# Patient Record
Sex: Female | Born: 1955 | Race: White | Hispanic: No | Marital: Married | Smoking: Former smoker
Health system: Southern US, Community
[De-identification: ages and names within clinical notes are randomized; demographics above are authoritative.]

## PROBLEM LIST (undated history)

## (undated) DIAGNOSIS — F419 Anxiety disorder, unspecified: Secondary | ICD-10-CM

## (undated) DIAGNOSIS — K635 Polyp of colon: Secondary | ICD-10-CM

## (undated) DIAGNOSIS — R112 Nausea with vomiting, unspecified: Secondary | ICD-10-CM

## (undated) DIAGNOSIS — G4700 Insomnia, unspecified: Secondary | ICD-10-CM

## (undated) DIAGNOSIS — K602 Anal fissure, unspecified: Secondary | ICD-10-CM

## (undated) DIAGNOSIS — K219 Gastro-esophageal reflux disease without esophagitis: Secondary | ICD-10-CM

## (undated) DIAGNOSIS — H8109 Meniere's disease, unspecified ear: Secondary | ICD-10-CM

## (undated) DIAGNOSIS — F329 Major depressive disorder, single episode, unspecified: Secondary | ICD-10-CM

## (undated) DIAGNOSIS — K589 Irritable bowel syndrome without diarrhea: Secondary | ICD-10-CM

## (undated) DIAGNOSIS — J309 Allergic rhinitis, unspecified: Secondary | ICD-10-CM

## (undated) DIAGNOSIS — E039 Hypothyroidism, unspecified: Secondary | ICD-10-CM

## (undated) DIAGNOSIS — H9190 Unspecified hearing loss, unspecified ear: Secondary | ICD-10-CM

## (undated) DIAGNOSIS — F4310 Post-traumatic stress disorder, unspecified: Secondary | ICD-10-CM

## (undated) DIAGNOSIS — E785 Hyperlipidemia, unspecified: Secondary | ICD-10-CM

## (undated) DIAGNOSIS — K5790 Diverticulosis of intestine, part unspecified, without perforation or abscess without bleeding: Secondary | ICD-10-CM

## (undated) DIAGNOSIS — M1990 Unspecified osteoarthritis, unspecified site: Secondary | ICD-10-CM

## (undated) DIAGNOSIS — G43909 Migraine, unspecified, not intractable, without status migrainosus: Secondary | ICD-10-CM

## (undated) DIAGNOSIS — G4733 Obstructive sleep apnea (adult) (pediatric): Secondary | ICD-10-CM

## (undated) DIAGNOSIS — F32A Depression, unspecified: Secondary | ICD-10-CM

## (undated) DIAGNOSIS — Z9889 Other specified postprocedural states: Secondary | ICD-10-CM

## (undated) DIAGNOSIS — J45909 Unspecified asthma, uncomplicated: Secondary | ICD-10-CM

## (undated) DIAGNOSIS — A630 Anogenital (venereal) warts: Secondary | ICD-10-CM

## (undated) HISTORY — DX: Anogenital (venereal) warts: A63.0

## (undated) HISTORY — PX: ABDOMINAL HYSTERECTOMY: SHX81

## (undated) HISTORY — PX: BUNIONECTOMY: SHX129

## (undated) HISTORY — DX: Insomnia, unspecified: G47.00

## (undated) HISTORY — DX: Anal fissure, unspecified: K60.2

## (undated) HISTORY — DX: Obstructive sleep apnea (adult) (pediatric): G47.33

## (undated) HISTORY — DX: Meniere's disease, unspecified ear: H81.09

## (undated) HISTORY — DX: Post-traumatic stress disorder, unspecified: F43.10

## (undated) HISTORY — DX: Depression, unspecified: F32.A

## (undated) HISTORY — DX: Hyperlipidemia, unspecified: E78.5

## (undated) HISTORY — DX: Gastro-esophageal reflux disease without esophagitis: K21.9

## (undated) HISTORY — PX: OTHER SURGICAL HISTORY: SHX169

## (undated) HISTORY — DX: Unspecified asthma, uncomplicated: J45.909

## (undated) HISTORY — DX: Irritable bowel syndrome, unspecified: K58.9

## (undated) HISTORY — DX: Major depressive disorder, single episode, unspecified: F32.9

## (undated) HISTORY — DX: Unspecified osteoarthritis, unspecified site: M19.90

## (undated) HISTORY — DX: Hypothyroidism, unspecified: E03.9

## (undated) HISTORY — DX: Allergic rhinitis, unspecified: J30.9

## (undated) HISTORY — DX: Migraine, unspecified, not intractable, without status migrainosus: G43.909

## (undated) HISTORY — DX: Polyp of colon: K63.5

## (undated) HISTORY — PX: TUBAL LIGATION: SHX77

---
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
## 1979-02-08 HISTORY — PX: CHOLECYSTECTOMY: SHX55

## 1979-02-08 HISTORY — PX: APPENDECTOMY: SHX54

## 1991-02-08 HISTORY — PX: INNER EAR SURGERY: SHX679

## 1997-08-25 ENCOUNTER — Emergency Department (HOSPITAL_COMMUNITY): Admission: EM | Admit: 1997-08-25 | Discharge: 1997-08-25 | Payer: Self-pay | Admitting: Emergency Medicine

## 1997-11-17 ENCOUNTER — Encounter: Payer: Self-pay | Admitting: Neurology

## 1997-11-17 ENCOUNTER — Ambulatory Visit (HOSPITAL_COMMUNITY): Admission: RE | Admit: 1997-11-17 | Discharge: 1997-11-17 | Payer: Self-pay | Admitting: Neurology

## 1998-01-29 ENCOUNTER — Inpatient Hospital Stay (HOSPITAL_COMMUNITY): Admission: AD | Admit: 1998-01-29 | Discharge: 1998-01-29 | Payer: Self-pay | Admitting: *Deleted

## 2000-09-11 ENCOUNTER — Encounter: Payer: Self-pay | Admitting: Otolaryngology

## 2000-09-11 ENCOUNTER — Encounter: Admission: RE | Admit: 2000-09-11 | Discharge: 2000-09-11 | Payer: Self-pay | Admitting: Otolaryngology

## 2002-02-11 ENCOUNTER — Emergency Department (HOSPITAL_COMMUNITY): Admission: EM | Admit: 2002-02-11 | Discharge: 2002-02-11 | Payer: Self-pay | Admitting: Emergency Medicine

## 2004-11-04 ENCOUNTER — Ambulatory Visit: Payer: Self-pay | Admitting: Family Medicine

## 2005-05-19 ENCOUNTER — Ambulatory Visit: Payer: Self-pay | Admitting: *Deleted

## 2005-05-20 ENCOUNTER — Observation Stay (HOSPITAL_COMMUNITY): Admission: EM | Admit: 2005-05-20 | Discharge: 2005-05-20 | Payer: Self-pay | Admitting: Emergency Medicine

## 2005-05-23 ENCOUNTER — Ambulatory Visit: Payer: Self-pay

## 2005-05-23 ENCOUNTER — Ambulatory Visit: Payer: Self-pay | Admitting: Internal Medicine

## 2006-06-10 ENCOUNTER — Emergency Department: Payer: Self-pay | Admitting: Emergency Medicine

## 2006-06-10 ENCOUNTER — Other Ambulatory Visit: Payer: Self-pay

## 2006-07-19 ENCOUNTER — Ambulatory Visit: Payer: Self-pay | Admitting: Unknown Physician Specialty

## 2006-08-03 ENCOUNTER — Ambulatory Visit: Payer: Self-pay | Admitting: Unknown Physician Specialty

## 2007-08-15 ENCOUNTER — Ambulatory Visit: Payer: Self-pay | Admitting: Unknown Physician Specialty

## 2007-12-14 ENCOUNTER — Encounter: Payer: Self-pay | Admitting: Gastroenterology

## 2008-01-14 ENCOUNTER — Ambulatory Visit: Payer: Self-pay | Admitting: Gastroenterology

## 2008-01-14 DIAGNOSIS — K219 Gastro-esophageal reflux disease without esophagitis: Secondary | ICD-10-CM | POA: Insufficient documentation

## 2008-01-14 DIAGNOSIS — K589 Irritable bowel syndrome without diarrhea: Secondary | ICD-10-CM | POA: Insufficient documentation

## 2008-01-15 ENCOUNTER — Telehealth: Payer: Self-pay | Admitting: Gastroenterology

## 2008-02-12 ENCOUNTER — Ambulatory Visit: Payer: Self-pay | Admitting: Gastroenterology

## 2008-02-14 ENCOUNTER — Telehealth: Payer: Self-pay | Admitting: Gastroenterology

## 2008-02-15 ENCOUNTER — Telehealth: Payer: Self-pay | Admitting: Gastroenterology

## 2008-02-18 ENCOUNTER — Telehealth: Payer: Self-pay | Admitting: Gastroenterology

## 2008-02-18 ENCOUNTER — Telehealth (INDEPENDENT_AMBULATORY_CARE_PROVIDER_SITE_OTHER): Payer: Self-pay | Admitting: *Deleted

## 2008-02-26 ENCOUNTER — Telehealth: Payer: Self-pay | Admitting: Gastroenterology

## 2008-02-27 ENCOUNTER — Telehealth: Payer: Self-pay | Admitting: Gastroenterology

## 2008-03-03 ENCOUNTER — Encounter: Payer: Self-pay | Admitting: Gastroenterology

## 2008-03-03 ENCOUNTER — Ambulatory Visit: Payer: Self-pay | Admitting: Gastroenterology

## 2008-03-03 LAB — HM COLONOSCOPY

## 2008-03-05 ENCOUNTER — Encounter: Payer: Self-pay | Admitting: Gastroenterology

## 2008-05-01 ENCOUNTER — Telehealth: Payer: Self-pay | Admitting: Gastroenterology

## 2008-05-15 ENCOUNTER — Emergency Department: Payer: Self-pay | Admitting: Emergency Medicine

## 2008-06-04 ENCOUNTER — Telehealth: Payer: Self-pay | Admitting: Gastroenterology

## 2008-08-12 ENCOUNTER — Emergency Department: Payer: Self-pay | Admitting: Emergency Medicine

## 2008-08-14 ENCOUNTER — Emergency Department: Payer: Self-pay | Admitting: Unknown Physician Specialty

## 2008-10-02 ENCOUNTER — Ambulatory Visit: Payer: Self-pay | Admitting: Unknown Physician Specialty

## 2009-04-09 ENCOUNTER — Encounter: Payer: Self-pay | Admitting: Gastroenterology

## 2009-11-25 ENCOUNTER — Ambulatory Visit: Payer: Self-pay | Admitting: Unknown Physician Specialty

## 2010-03-09 NOTE — Letter (Signed)
Summary: ENT/[facility]  ENT/[facility]   Imported By: Sherian Rein 04/29/2009 15:12:50  _____________________________________________________________________  External Attachment:    Type:   Image     Comment:   External Document

## 2010-06-25 NOTE — Discharge Summary (Signed)
NAMEPAMILA, Jenna Garner                  ACCOUNT NO.:  [id number]   MEDICAL RECORD NO.:  [id number]          PATIENT TYPE:  OBV   LOCATION:  2005                         FACILITY:  [facility]   PHYSICIAN:  Jesse Sans. Wall, M.D.   DATE OF BIRTH:  06/12/1955   DATE OF ADMISSION:  05/19/2005  DATE OF DISCHARGE:  05/20/2005                                 DISCHARGE SUMMARY   PRIMARY CARDIOLOGIST:  Maisie Fus C. Wall, M.D.   PRIMARY CARE PHYSICIAN:  No known primary care physician.   DISCHARGE DIAGNOSIS:  Chest pain with negative cardiac work-up.   PAST MEDICAL HISTORY:  1.  Meniere's' disease.  2.  Irritable bowel syndrome.  3.  Post traumatic stress disorder.  4.  Hypothyroidism.  5.  Obstructive sleep apnea.  6.  Status post TAH/BSO.  7.  Status post cholecystectomy.   ALLERGIES:  EGGS, PENICILLIN, SULFA and CODEINE.   HOSPITAL COURSE:  [name] is a 55 year old African American female with no  known cardiac history who presented to [facility]  Emergency Room for evaluation of chest pain and pressure.  Apparently  patient borrowed three of her husband's nitroglycerin which did alleviate  her pain.  She presented to [facility] [employer] [facility] for further  evaluation.  Initial EKG in the emergency room showed a normal sinus rhythm  with no evidence of ST or T-wave changes.  Initial cardiac markers were  negative.  Chest x-ray revealed no acute cardiopulmonary processes.  Lab  work stable with hematocrit of 42, BUN 13, creatinine 1.  Patient was  admitted for 23-hour observation to rule out myocardial infarction.  Patient  was not given aspirin secondary to her Meniere's' disease/aspirin  intolerance.  Patient without any further episodes of chest discomfort.  Blood pressure stable at 103/65, afebrile, sat 95% on room air.  Cardiac  enzymes negative x2.  Patient discharged home after seen by Dr. Juanito Doom on  the day of discharge with arrangements for an outpatient  Myoview.  Stress  test was arranged for May 23, 2005, at 9 a.m.  Follow-up with Dr. Vern Claude  extender on May 25, 2005, at 1:45.  Patient instructed in regard to  prestress test instructions and verbalizes understanding.   DISCHARGE MEDICATIONS:  1.  Triamterene/hydrochlorothiazide 37.5/25 mg daily.  2.  Premarin 0.3 mg daily.  3.  Prozac 50 mg daily.  4.  Allegra 180 mg daily.  5.  Cytomel 0.025 mg daily.  6.  Wellbutrin 150 mg p.o. b.i.d.  7.  Valtrex 500 mg daily.  8.  Singulair 10 mg daily.  9.  Aspirin 81 mg daily as tolerated.  10. Lodine 500 mg p.o. b.i.d.  11. Meclizine p.r.n.  12. __________ as previously prescribed.   DURATION OF DISCHARGE ENCOUNTER:  25 minutes.      Dorian Pod, NP      Jesse Sans. Wall, M.D.  Electronically Signed    MB/MEDQ  D:  06/27/2005  T:  06/28/2005  Job:  [id number]

## 2010-06-25 NOTE — H&P (Signed)
NAMELAVONN, Jenna Garner NO.:  [id number]   MEDICAL RECORD NO.:  [id number]          PATIENT TYPE:  EMS   LOCATION:  MAJO                         FACILITY:  [facility]   PHYSICIAN:  Audery Amel, MD     DATE OF BIRTH:  12/14/1955   DATE OF ADMISSION:  05/19/2005  DATE OF DISCHARGE:                                HISTORY & PHYSICAL   CARDIOLOGIST:  Unattached.   CHIEF COMPLAINT:  Right chest pain and pressure.   HISTORY OF PRESENT ILLNESS:  The patient is a 55 year old black female with  no prior cardiac history who presents to the emergency department  for  evaluation of chest pain and pressure. The patient characterizes this pain  as left-sided and radiating to her left neck. She states that the pain was  while at rest earlier this evening. She notes associated with no vomiting  and mild shortness of breath at the time of onset. She denies any  diaphoresis or palpitations. The patient borrowed three of her husband's  nitroglycerin which did alleviate her pain. There were no precipitating  factors and she denies any palpable tinnitus or pain with deep inspiration.  Currently she is chest pain free. Her EKG in the emergency department  showed a normal sinus rhythm with no evidence of anterior ischemia. Her  initial cardiac markers were negative; otherwise, she had no complaints.   PAST MEDICAL HISTORY:  1.  Meniere's disease.  2.  IBS.  3.  PTSD.  4.  Hypothyroidism.  5.  Obstructive sleep apnea.  6.  Status post cholecystectomy.  7.  Status post TAH/BSO.   ALLERGIES:  EGGS, PENICILLIN, SULFA, CODEINE.   MEDICATIONS:  1.  Allegra 180 mg daily.  2.  Astelin nasal spray, one spray each nostril daily.  3.  Amour thyroid, dose unknown.  4.  Dyazide 37/25 mg daily.  5.  Premarin 0.3 mg daily.  6.  Prozac 20 mg daily.  7.  Xanax 0.5 mg daily.  8.  Wellbutrin XR 150 mg b.i.d.  9.  Valtrex 500 mg daily.  10. Singulair 10 mg daily.  11. Lodine 500 mg daily.   SOCIAL HISTORY:  The patient lives in [place] with her husband and is on  disability  from her Meniere's disease. She denies any history of tobacco,  alcohol, or illicit substance use. She does lead a relatively sedentary  lifestyle.   FAMILY HISTORY:  There is no history of coronary artery disease in her  mother, father, or siblings. However, on her mother's side of the family she  did have an uncle who died  of a myocardial infarction at age 71.   REVIEW OF SYSTEMS:  CONSTITUTIONAL: No fever, chills, sweats, or adenopathy.  HEENT: No headache, sore throat, photophobia, or change in vision. SKIN: No  rash or lesions. CARDIOPULMONARY: Per HPI. GU: No frequency, urgency, or  dysuria. NEUROPSYCHIATRIC:  No weakness, numbness, or mood disturbance.  MUSCULOSKELETAL: No myalgias, arthralgias, or joint swellings. GI: No  vomiting, diarrhea, bright red blood per rectum, melena, hematemesis, or  dysphagia. ENDOCRINE: No polyuria  or polydipsia. No heat or cold  intolerance. All other review of systems are negative.   PHYSICAL EXAMINATION:  VITAL SIGNS: Blood pressure 128/100, pulse 77, O2  saturation is 96% on room air.  GENERAL: The patient is alert and oriented times three, in no acute  distress, pleasantly conversant.  HEENT: Normocephalic and atraumatic. EOMI. PERRL. Nose patent. Oropharynx  clear.  NECK: Supple. Full range of motion. No JVD.  LYMPHADENOPATHY: None.  CARDIOVASCULAR: Normal S1 and S2 without murmurs, rubs, or gallops, with 2+  symmetric pulses bilaterally. No bruits.  LUNGS: Clear to auscultation bilaterally.  SKIN: No rashes or lesion.  ABDOMEN: Soft, nontender, nondistended. Positive bowel sounds. No  hepatosplenomegaly.  EXTREMITIES: No clubbing, cyanosis, or edema. There is no rash or lesion.  MUSCULOSKELETAL: No joint deformities or effusion.  NEUROLOGIC: Cranial nerves II-XII are grossly intact; 5/5 strength in  bilateral extremities. Sensation is grossly intact.    Chest x-ray reveals no acute cardiopulmonary process. EKG reveals a normal  sinus rhythm with no evidence of injury or ischemia.   LABORATORY DATA:  White blood cell count 7.3, hematocrit 42, platelet count  277,000. Sodium 136, potassium 3.9, chloride 99, CO2 28, BUN 13, creatinine  1.0, glucose 89. CK-MB less than 1, troponin less than 0.05.   IMPRESSION:  A 55 year old white female with left-sided chest pain, atypical  for cardiac origin.   Admit for 23 hours observation to rule out myocardial infarction. Cycle  cardiac biomarkers and serial EKGs. A 24-hour telemetry. The patient has  aspirin intolerance due to her Meniere's disease and therefore cannot  tolerate. Will continue with her home medication regimen. The patient is  unclear of her Armour thyroid dose for her hyperthyroidism. This will have  to be clarified in the morning. The patient rules out for myocardial  infarction, given the atypical nature of her symptoms and lack of risk  factors would favor noninvasive evaluation as an outpatient.           ______________________________  Audery Amel, MD     SHG/MEDQ  D:  05/20/2005  T:  05/20/2005  Job:  [id number]

## 2010-08-16 LAB — TSH: TSH: 5.23 u[IU]/mL (ref 0.41–5.90)

## 2010-08-16 LAB — BASIC METABOLIC PANEL
BUN: 11 mg/dL (ref 4–21)
Creatinine: 1 mg/dL (ref 0.5–1.1)
Glucose: 95 mg/dL

## 2010-08-16 LAB — HEPATIC FUNCTION PANEL
ALT: 27 U/L (ref 7–35)
Alkaline Phosphatase: 72 U/L (ref 25–125)
Bilirubin, Total: 0.8 mg/dL

## 2010-08-16 LAB — LIPID PANEL: LDL Cholesterol: 131 mg/dL

## 2010-08-30 DIAGNOSIS — M545 Low back pain, unspecified: Secondary | ICD-10-CM | POA: Insufficient documentation

## 2010-08-30 DIAGNOSIS — J301 Allergic rhinitis due to pollen: Secondary | ICD-10-CM | POA: Insufficient documentation

## 2010-08-30 DIAGNOSIS — K5730 Diverticulosis of large intestine without perforation or abscess without bleeding: Secondary | ICD-10-CM | POA: Insufficient documentation

## 2011-01-13 ENCOUNTER — Ambulatory Visit: Payer: Self-pay | Admitting: Unknown Physician Specialty

## 2011-09-07 LAB — LIPID PANEL: Triglycerides: 203 mg/dL — AB (ref 40–160)

## 2011-09-07 LAB — HEPATIC FUNCTION PANEL: ALT: 28 U/L (ref 7–35)

## 2011-09-07 LAB — BASIC METABOLIC PANEL
BUN: 11 mg/dL (ref 4–21)
Creatinine: 0.9 mg/dL (ref 0.5–1.1)
Glucose: 93 mg/dL
Sodium: 130 mmol/L — AB (ref 137–147)

## 2011-09-07 LAB — CBC AND DIFFERENTIAL: Platelets: 283 10*3/uL (ref 150–399)

## 2011-12-12 LAB — HM MAMMOGRAPHY

## 2012-05-29 ENCOUNTER — Ambulatory Visit: Payer: Self-pay | Admitting: Unknown Physician Specialty

## 2012-08-23 ENCOUNTER — Telehealth: Payer: Self-pay | Admitting: Internal Medicine

## 2012-08-23 ENCOUNTER — Ambulatory Visit (INDEPENDENT_AMBULATORY_CARE_PROVIDER_SITE_OTHER): Payer: Medicare Other | Admitting: Internal Medicine

## 2012-08-23 ENCOUNTER — Encounter: Payer: Self-pay | Admitting: Internal Medicine

## 2012-08-23 ENCOUNTER — Other Ambulatory Visit (INDEPENDENT_AMBULATORY_CARE_PROVIDER_SITE_OTHER): Payer: Medicare Other

## 2012-08-23 VITALS — BP 140/98 | HR 65 | Temp 97.0°F | Ht 61.75 in | Wt 188.4 lb

## 2012-08-23 DIAGNOSIS — M1710 Unilateral primary osteoarthritis, unspecified knee: Secondary | ICD-10-CM

## 2012-08-23 DIAGNOSIS — F32A Depression, unspecified: Secondary | ICD-10-CM

## 2012-08-23 DIAGNOSIS — G43909 Migraine, unspecified, not intractable, without status migrainosus: Secondary | ICD-10-CM | POA: Insufficient documentation

## 2012-08-23 DIAGNOSIS — J45909 Unspecified asthma, uncomplicated: Secondary | ICD-10-CM | POA: Insufficient documentation

## 2012-08-23 DIAGNOSIS — N951 Menopausal and female climacteric states: Secondary | ICD-10-CM

## 2012-08-23 DIAGNOSIS — IMO0002 Reserved for concepts with insufficient information to code with codable children: Secondary | ICD-10-CM

## 2012-08-23 DIAGNOSIS — E785 Hyperlipidemia, unspecified: Secondary | ICD-10-CM

## 2012-08-23 DIAGNOSIS — H8109 Meniere's disease, unspecified ear: Secondary | ICD-10-CM | POA: Insufficient documentation

## 2012-08-23 DIAGNOSIS — E039 Hypothyroidism, unspecified: Secondary | ICD-10-CM

## 2012-08-23 DIAGNOSIS — G4733 Obstructive sleep apnea (adult) (pediatric): Secondary | ICD-10-CM | POA: Insufficient documentation

## 2012-08-23 DIAGNOSIS — Z0000 Encounter for general adult medical examination without abnormal findings: Secondary | ICD-10-CM

## 2012-08-23 DIAGNOSIS — F329 Major depressive disorder, single episode, unspecified: Secondary | ICD-10-CM

## 2012-08-23 DIAGNOSIS — M79604 Pain in right leg: Secondary | ICD-10-CM

## 2012-08-23 DIAGNOSIS — E871 Hypo-osmolality and hyponatremia: Secondary | ICD-10-CM

## 2012-08-23 DIAGNOSIS — F4310 Post-traumatic stress disorder, unspecified: Secondary | ICD-10-CM | POA: Insufficient documentation

## 2012-08-23 DIAGNOSIS — J309 Allergic rhinitis, unspecified: Secondary | ICD-10-CM | POA: Insufficient documentation

## 2012-08-23 DIAGNOSIS — N959 Unspecified menopausal and perimenopausal disorder: Secondary | ICD-10-CM

## 2012-08-23 DIAGNOSIS — A630 Anogenital (venereal) warts: Secondary | ICD-10-CM | POA: Insufficient documentation

## 2012-08-23 LAB — HEPATIC FUNCTION PANEL
Alkaline Phosphatase: 66 U/L (ref 39–117)
Bilirubin, Direct: 0.1 mg/dL (ref 0.0–0.3)
Total Bilirubin: 0.6 mg/dL (ref 0.3–1.2)

## 2012-08-23 LAB — CBC WITH DIFFERENTIAL/PLATELET
Basophils Absolute: 0 10*3/uL (ref 0.0–0.1)
Eosinophils Absolute: 0.1 10*3/uL (ref 0.0–0.7)
Eosinophils Relative: 2.1 % (ref 0.0–5.0)
HCT: 39.5 % (ref 36.0–46.0)
Lymphs Abs: 3.4 10*3/uL (ref 0.7–4.0)
MCHC: 33.8 g/dL (ref 30.0–36.0)
MCV: 97.8 fl (ref 78.0–100.0)
Monocytes Absolute: 0.6 10*3/uL (ref 0.1–1.0)
Platelets: 243 10*3/uL (ref 150.0–400.0)
RDW: 12.9 % (ref 11.5–14.6)

## 2012-08-23 LAB — BASIC METABOLIC PANEL
BUN: 11 mg/dL (ref 6–23)
CO2: 28 mEq/L (ref 19–32)
Chloride: 88 mEq/L — ABNORMAL LOW (ref 96–112)
Glucose, Bld: 92 mg/dL (ref 70–99)
Potassium: 3.7 mEq/L (ref 3.5–5.1)

## 2012-08-23 LAB — LIPID PANEL
LDL Cholesterol: 116 mg/dL — ABNORMAL HIGH (ref 0–99)
VLDL: 25 mg/dL (ref 0.0–40.0)

## 2012-08-23 MED ORDER — CITALOPRAM HYDROBROMIDE 20 MG PO TABS: 20.0000 mg | ORAL_TABLET | Freq: Every day | ORAL | Status: DC

## 2012-08-23 MED ORDER — DICYCLOMINE HCL 10 MG PO CAPS: 10.0000 mg | ORAL_CAPSULE | Freq: Three times a day (TID) | ORAL | Status: DC

## 2012-08-23 MED ORDER — CARISOPRODOL 350 MG PO TABS: 350.0000 mg | ORAL_TABLET | Freq: Two times a day (BID) | ORAL | Status: DC | PRN

## 2012-08-23 NOTE — Progress Notes (Signed)
Subjective:    Patient ID: Jenna Garner, female    DOB: May 31, 1955, 57 y.o.   MRN: [id number]  HPI New patient to me and our practice, here to establish care Reviewed chronic medical issues today  Also Here for annual wellness  Diet: heart healthy  Physical activity: sedentary Depression/mood screen: negative Hearing: intact to whispered voice Visual acuity: grossly normal, performs annual eye exam  ADLs: capable Fall risk: none Home safety: good Cognitive evaluation: intact to orientation, naming, recall and repetition EOL planning: adv directives, full code/ I agree  I have personally reviewed and have noted 1. The patient's medical and social history 2. Their use of alcohol, tobacco or illicit drugs 3. Their current medications and supplements 4. The patient's functional ability including ADL's, fall risks, home safety risks and hearing or visual impairment. 5. Diet and physical activities 6. Evidence for depression or mood disorders  Past Medical History  Diagnosis Date  . Irritable bowel syndrome   . GERD   . Asthma   . Arthritis   . Depression     overlap with GAD  . Hyperlipidemia   . Migraines   . Hypothyroid   . Genital warts   . Allergic rhinitis   . Meniere's disease   . PTSD (post-traumatic stress disorder)   . OSA (obstructive sleep apnea)    Family History  Problem Relation Age of Onset  . Arthritis    . Hyperlipidemia    . Coronary artery disease    . Hypertension    . Leukemia Mother    History  Substance Use Topics  . Smoking status: Former Games developer  . Smokeless tobacco: Not on file  . Alcohol Use: No    Review of Systems Constitutional: Negative for fever or weight change.  Respiratory: Negative for cough and shortness of breath.   Cardiovascular: Negative for chest pain or palpitations.  Gastrointestinal: Negative for abdominal pain, no bowel changes.  Musculoskeletal: Negative for gait problem or joint swelling.  Skin: Negative for  rash.  Neurological: Negative for dizziness or headache.  No other specific complaints in a complete review of systems (except as listed in HPI above).     Objective:   Physical Exam BP 140/98  Pulse 65  Temp(Src) 97 F (36.1 C) (Oral)  Ht 5' 1.75" (1.568 m)  Wt 188 lb 6.4 oz (85.458 kg)  BMI 34.76 kg/m2  SpO2 98% Wt Readings from Last 3 Encounters:  08/23/12 188 lb 6.4 oz (85.458 kg)  01/14/08 164 lb (74.39 kg)   Constitutional: She is overweight, but appears well-developed and well-nourished. No distress.  HENT: Head: Normocephalic and atraumatic. Ears: B TMs ok, no erythema or effusion; Nose: Nose normal. Mouth/Throat: Oropharynx is clear and moist. No oropharyngeal exudate.  Eyes: Conjunctivae and EOM are normal. Pupils are equal, round, and reactive to light. No scleral icterus.  Neck: Normal range of motion. Neck supple. No JVD present. No thyromegaly present.  Cardiovascular: Normal rate, regular rhythm and normal heart sounds.  No murmur heard. No BLE edema. Pulmonary/Chest: Effort normal and breath sounds normal. No respiratory distress. She has no wheezes.  Abdominal: Soft. Bowel sounds are normal. She exhibits no distension. There is no tenderness. no masses Musculoskeletal: Normal range of motion, no joint effusions. No gross deformities Neurological: She is alert and oriented to person, place, and time. No cranial nerve deficit. Coordination, balance, strength, speech and gait are normal.  Skin: Skin is warm and dry. No rash noted. No erythema. Ingrown great toenail,  R 1st nail - pending appt with podiatry Psychiatric: She has a mildly anxious mood and affect. Her behavior is appropriate. Judgment and thought content normal.   No results found for this basename: WBC, HGB, HCT, PLT, GLUCOSE, CHOL, TRIG, HDL, LDLDIRECT, LDLCALC, ALT, AST, NA, K, CL, CREATININE, BUN, CO2, TSH, PSA, INR, GLUF, HGBA1C, MICROALBUR        Assessment & Plan:   AWV/v70.0 - Today patient  counseled on age appropriate routine health concerns for screening and prevention, each reviewed and up to date or declined. Immunizations reviewed and up to date or declined. Labs ordered and reviewed. Risk factors for depression reviewed. Hearing function and visual acuity are intact. ADLs screened and addressed as needed. Functional ability and level of safety reviewed and appropriate. Education, counseling and referrals performed based on assessed risks today. Patient provided with a copy of personalized plan for preventive services.  PMP state - check DEXA and refer to gyn for PAP/pelvic  Also See problem list. Medications and labs reviewed today.

## 2012-08-23 NOTE — Assessment & Plan Note (Signed)
On replacement meds Check TSH now and adjust dose as needed

## 2012-08-23 NOTE — Telephone Encounter (Signed)
Pt was here today.  She has arthritis.  She is having pain on right side and down her leg.  She wants to know if she should have physical therapy or an MRI before she starts going to [employer] for exercise.

## 2012-08-23 NOTE — Patient Instructions (Signed)
It was good to see you today. We have reviewed your prior records including labs and tests today Medications reviewed and updated, no changes recommended at this time. Refill on medication(s) as discussed today. Test(s) ordered today. Your results will be released to MyChart (or called to you) after review, usually within 72hours after test completion. If any changes need to be made, you will be notified at that same time. we'll make referral to sleep specialist, gynecology and for DEXA bone scan. Our office will contact you regarding appointment(s) once made. we will send to your prior provider(s) for "release of records" from Dr Theadore Nan as discussed today. Please schedule followup in 3-4 months, call sooner if problems.

## 2012-08-23 NOTE — Assessment & Plan Note (Signed)
CPAP at home, but not using same due to poor fit Refer to sleep specialist now to review hx and recommended tx

## 2012-08-23 NOTE — Assessment & Plan Note (Signed)
Hx same but denies prior med rx for same Controls with exercise and weight - (no longer able to take flax seed because of diverticulosis per pt) Check lipids and check as needed

## 2012-08-23 NOTE — Assessment & Plan Note (Signed)
Complicated by PTSD and anxiety Previously followed with behav health, no counseling or psyc care in past few years Reports symptoms stable on current meds The current medical regimen is effective;  continue present plan and medications.

## 2012-08-24 ENCOUNTER — Institutional Professional Consult (permissible substitution): Payer: Medicare Other | Admitting: Pulmonary Disease

## 2012-08-24 ENCOUNTER — Telehealth: Payer: Self-pay

## 2012-08-24 ENCOUNTER — Encounter: Payer: Self-pay | Admitting: Obstetrics & Gynecology

## 2012-08-24 DIAGNOSIS — E871 Hypo-osmolality and hyponatremia: Secondary | ICD-10-CM | POA: Insufficient documentation

## 2012-08-24 NOTE — Assessment & Plan Note (Signed)
?  acute or chronic - Review labs from prior PCP as planned (ROI done yesterday) Stop maxide diuretic 16 ounces of electrolyte replacement solution daily for next 72 hours, then recheck basic metabolic Patient euvolemic on exam, appears asymptomatic so suspect chronic condition

## 2012-08-24 NOTE — Telephone Encounter (Signed)
Noted - ok for [facility] to cancel PT referral Thanks for the update

## 2012-08-24 NOTE — Telephone Encounter (Signed)
Called pt no answer LMOM md response...lmb 

## 2012-08-24 NOTE — Addendum Note (Signed)
Addended by: Rene Paci A on: 08/24/2012 08:27 AM   Modules accepted: Orders, Medications

## 2012-08-24 NOTE — Telephone Encounter (Signed)
Patient called [facility] stating that she has been set up for PT through a program at the [facility], however, they will not start any therapy since she is having sciatic pain.  Pt states that she will need to have an MRI, or see an orthopedic specialist.  Per previous phone not MD will refer to different PT. Pt notified per previous MD response and states that "she appreciates that advisement, but she can not afford PT. She continued to say that she knows what's going on with her and knows what she needs, therefore do not schedule an appointment for PT".

## 2012-08-24 NOTE — Telephone Encounter (Signed)
i will refer to PT - no MRI needed at this time

## 2012-09-06 ENCOUNTER — Telehealth: Payer: Self-pay | Admitting: *Deleted

## 2012-09-06 NOTE — Telephone Encounter (Signed)
Pt called states she does not need the appointment at [facility], she is going to [facility] at [facility].

## 2012-09-06 NOTE — Telephone Encounter (Signed)
Corrie Dandy are you able to cancel this referral?

## 2012-09-14 ENCOUNTER — Telehealth: Payer: Self-pay | Admitting: Internal Medicine

## 2012-09-14 NOTE — Telephone Encounter (Signed)
rec'd records from [facility], Forward 48 page's to Dr. Felicity Coyer

## 2012-09-18 ENCOUNTER — Encounter: Payer: Self-pay | Admitting: Internal Medicine

## 2012-09-19 ENCOUNTER — Encounter: Payer: Medicare Other | Admitting: Obstetrics & Gynecology

## 2012-10-10 ENCOUNTER — Encounter: Payer: Self-pay | Admitting: Gastroenterology

## 2012-10-12 ENCOUNTER — Telehealth: Payer: Self-pay | Admitting: *Deleted

## 2012-10-12 NOTE — Telephone Encounter (Signed)
[facility] Triage Call Report Triage Record Num: [id number] Operator: Craig Guess Patient Name: Jenna Garner Call Date & Time: 10/11/2012 8:00:01PM Patient Phone: [phone] PCP: Rene Paci Patient Gender: Female PCP Fax : [phone] Patient DOB: 1955-09-02 Practice Name: [facility] Reason for Call: Caller: Tim/Spouse; PCP: Rene Paci (Adults only); CB#: [phone]; Call regarding Migraine; RN called and spoke with Aurea Graff who reports she has a migraine and the Tylenol Arthritis (2 tabs at 11:30) that she normally takes with relief has not helped ease headache. Symptoms began around 11:30 this am, Thurs 10/11/12. She has not eaten since breakfast and is asking if she can take a dose of Ibuprofen. Pain at 9/10 at present. Per Headache Protocol, Emergent symptoms ruled out. Typical headache AND usual therapy is not working, See Provider within 24 hours. Home care given. Caller advised she can be seen in the ED as needed if home care and Motrin does not help. She is agreeable and will call the office in the am for an appointment if no relief. Protocol(s) Used: Headache Recommended Outcome per Protocol: See Provider within 24 hours Reason for Outcome: Typical headache AND usual therapy is not available or is not working Care Advice: ~ Do not skip or delay meals, unless vomiting. ~ Call provider if symptoms worsen or new symptoms develop. Analgesic/Antipyretic Advice - NSAIDs: Consider aspirin, ibuprofen, naproxen or ketoprofen for pain or fever as directed on label or by pharmacist/provider. PRECAUTIONS: - If over 44 years of age, should not take longer than 1 week without consulting provider. EXCEPTIONS: - Should not be used if taking blood thinners or have bleeding problems. - Do not use if have history of sensitivity/allergy to any of these medications; or history of cardiovascular, ulcer, kidney, liver disease or diabetes unless approved by provider. - Do not exceed  recommended dose or frequency. ~ Migraine Self Care: - At the first sign of a migraine apply a cold cloth or cloth-covered ice pack to your head or the back of the neck. - Apply pressure or massage scalp and temples. - Lie down in a quiet, dark room for several hours. - Minimize noise, light, and odors, especially cooking and tobacco odors. - Do not read or use a computer. ~ Total water intake includes drinking water, water in beverages, and water contained in food. Fluids make up about 80% of the body's total hydration need. Individual fluid requirement to maintain hydration vary based on physical activity, environmental factors and illness. Limit fluids that contain sugar, caffeine, or alcohol. Urine will be very light yellow color when you drink enough fluids. ~ 09/

## 2012-10-15 ENCOUNTER — Other Ambulatory Visit: Payer: Self-pay | Admitting: *Deleted

## 2012-10-15 MED ORDER — ALPRAZOLAM 0.5 MG PO TABS: 0.5000 mg | ORAL_TABLET | Freq: Three times a day (TID) | ORAL | Status: DC | PRN

## 2012-10-15 NOTE — Telephone Encounter (Signed)
Faxed script back to [facility].../lmb 

## 2012-10-26 ENCOUNTER — Other Ambulatory Visit: Payer: Self-pay | Admitting: Internal Medicine

## 2012-10-26 NOTE — Telephone Encounter (Signed)
Uha, pharmacist called requesting refill on Triamterene/HCTZ 37.5/25mg  upon review of last OV on 7.17.14.  Dr Felicity Coyer discontinued this medication.  Please advise inDr Leschber's absence.

## 2012-10-30 ENCOUNTER — Telehealth: Payer: Self-pay | Admitting: *Deleted

## 2012-10-30 MED ORDER — TRIAMTERENE-HCTZ 37.5-25 MG PO CAPS: 1.0000 | ORAL_CAPSULE | Freq: Every day | ORAL | Status: DC

## 2012-10-30 NOTE — Telephone Encounter (Signed)
Jenna Garner called requesting refill on Triamterene/HCTZ 37.5/25mg  upon review of last OV on 7.17.14. Dr Felicity Coyer discontinued this medication.  Please advise

## 2012-10-30 NOTE — Telephone Encounter (Signed)
We recommended to discontinue this medication in July because of low sodium. Patient Did not return return for labs recheck as requested. Okay to authorize refill, but please ask patient to schedule office visit to recheck labs and review

## 2012-10-31 NOTE — Telephone Encounter (Signed)
Spoke with pt states she can not come in for OV until 10.22.14.  appt already scheduled.  Further advised Rx complete

## 2012-11-12 ENCOUNTER — Telehealth: Payer: Self-pay | Admitting: *Deleted

## 2012-11-12 MED ORDER — VALACYCLOVIR HCL 500 MG PO TABS: 500.0000 mg | ORAL_TABLET | Freq: Every day | ORAL | Status: DC

## 2012-11-12 NOTE — Telephone Encounter (Signed)
Ok erx done

## 2012-11-12 NOTE — Telephone Encounter (Signed)
Pt called requesting Valcyclovir refill.  Please advise 

## 2012-11-13 ENCOUNTER — Other Ambulatory Visit: Payer: Self-pay | Admitting: *Deleted

## 2012-11-13 MED ORDER — VALACYCLOVIR HCL 500 MG PO TABS: 500.0000 mg | ORAL_TABLET | Freq: Every day | ORAL | Status: DC

## 2012-11-13 NOTE — Telephone Encounter (Signed)
Spoke with pt advised Rx sent 

## 2012-11-19 ENCOUNTER — Ambulatory Visit: Payer: Medicare Other | Admitting: Internal Medicine

## 2012-11-28 ENCOUNTER — Ambulatory Visit: Payer: Medicare Other | Admitting: Internal Medicine

## 2013-02-01 ENCOUNTER — Emergency Department: Payer: Self-pay | Admitting: Emergency Medicine

## 2013-02-01 LAB — CBC
HCT: 40.2 % (ref 35.0–47.0)
HGB: 14.1 g/dL (ref 12.0–16.0)
MCHC: 35 g/dL (ref 32.0–36.0)
MCV: 96 fL (ref 80–100)
Platelet: 251 10*3/uL (ref 150–440)
RDW: 12.8 % (ref 11.5–14.5)
WBC: 8.4 10*3/uL (ref 3.6–11.0)

## 2013-02-01 LAB — COMPREHENSIVE METABOLIC PANEL
Alkaline Phosphatase: 86 U/L
Anion Gap: 7 (ref 7–16)
BUN: 16 mg/dL (ref 7–18)
Calcium, Total: 9.3 mg/dL (ref 8.5–10.1)
Co2: 29 mmol/L (ref 21–32)
EGFR (African American): >60
Glucose: 115 mg/dL — ABNORMAL HIGH (ref 65–99)
Osmolality: 268 (ref 275–301)
Potassium: 3.7 mmol/L (ref 3.5–5.1)
SGPT (ALT): 31 U/L (ref 12–78)
Sodium: 133 mmol/L — ABNORMAL LOW (ref 136–145)
Total Protein: 7.6 g/dL (ref 6.4–8.2)

## 2013-02-01 LAB — TROPONIN I: Troponin-I: <0.02 ng/mL

## 2013-02-28 ENCOUNTER — Telehealth: Payer: Self-pay | Admitting: *Deleted

## 2013-02-28 ENCOUNTER — Ambulatory Visit (INDEPENDENT_AMBULATORY_CARE_PROVIDER_SITE_OTHER): Payer: Medicare HMO | Admitting: Emergency Medicine

## 2013-02-28 ENCOUNTER — Encounter: Payer: Self-pay | Admitting: Emergency Medicine

## 2013-02-28 VITALS — BP 149/82 | HR 64 | Temp 97.5°F | Ht 62.0 in | Wt 187.0 lb

## 2013-02-28 DIAGNOSIS — Z0000 Encounter for general adult medical examination without abnormal findings: Secondary | ICD-10-CM

## 2013-02-28 NOTE — Progress Notes (Signed)
Subjective:    Patient ID: Jenna Garner, female    DOB: Sep 21, 1955, 58 y.o.   MRN: [id number]  HPI Jenna Garner is here for a new patient appointment.  She reports exercising "when I can" doing floor stretches.  This is typically about once a week.  Tries to watch her diet; has lots of restrictions secondary to IBS and diverticulosis.  Current Outpatient Prescriptions on File Prior to Visit  Medication Sig Dispense Refill  . albuterol (PROAIR HFA) 108 (90 BASE) MCG/ACT inhaler Inhale 2 puffs into the lungs every 6 (six) hours as needed for wheezing.      Marland Kitchen ALPRAZolam (XANAX) 0.5 MG tablet Take 1 tablet (0.5 mg total) by mouth 3 (three) times daily as needed for sleep or anxiety.  90 tablet  0  . carisoprodol (SOMA) 350 MG tablet Take 1 tablet (350 mg total) by mouth 2 (two) times daily as needed for muscle spasms.  30 tablet  3  . cetirizine (ZYRTEC) 10 MG tablet Take 10 mg by mouth daily.      . citalopram (CELEXA) 20 MG tablet Take 1 tablet (20 mg total) by mouth daily.      Marland Kitchen esomeprazole (NEXIUM) 40 MG capsule Take 40 mg by mouth daily before breakfast.      . fexofenadine (ALLEGRA) 180 MG tablet Take 180 mg by mouth daily.      Marland Kitchen gabapentin (NEURONTIN) 300 MG capsule Take 1 at bedtime      . levothyroxine (SYNTHROID, LEVOTHROID) 50 MCG tablet Take 1 by mouth daily      . meclizine (ANTIVERT) 25 MG tablet Take 25 mg by mouth 3 (three) times daily as needed.      . meloxicam (MOBIC) 15 MG tablet Take 15 mg by mouth daily.      . montelukast (SINGULAIR) 10 MG tablet Take 1 by mouth daily      . triamterene-hydrochlorothiazide (DYAZIDE) 37.5-25 MG per capsule Take 1 each (1 capsule total) by mouth daily.  30 capsule  1  . valACYclovir (VALTREX) 500 MG tablet Take 1 tablet (500 mg total) by mouth daily.  90 tablet  1   No current facility-administered medications on file prior to visit.    I have reviewed and updated the following as appropriate: allergies, current medications, past family  history, past medical history, past social history, past surgical history and problem list SHx: former smoker - Hypothyroidism: dose has been stable for several years; last TSH good in 08/2012 - Depression: Reports as well controlled.  On celexa and xanax - states xanax is mostly for dizziness from Meneire's disease - Asthma: mostly allergic triggers; has not used albuterol in 6 months - Arthritis: in her low back; she takes soma and norco for this.  Very rarely used the norco.  Health Maintenance: up to date on mammogram and colonoscopy (sees Dr. Russella Dar); unsure if she still has a cervix, will plan on a pelvic exam in the next year    Review of Systems Vaginal dryness, other negative    Objective:   Physical Exam BP 149/82  Pulse 64  Temp(Src) 97.5 F (36.4 C) (Oral)  Ht 5\' 2"  (1.575 m)  Wt 187 lb (84.823 kg)  BMI 34.19 kg/m2 Gen: alert, cooperative, NAD HEENT: AT/[place], sclera white, MMM, PERRL Neck: supple, no LAD, normal thyroid, no bruits CV: RRR, no murmurs Pulm: CTAB, no wheezes or rales Abd: +BS, soft, NTND Ext: no edema, 2+ DP pulses bilaterally Neuro: 5/5 strength in all  extremities; no focal deficits      Assessment & Plan:  New Patient Visit: Reviewed and updated history and medications in EPIC.  Discussed health maintenance as above.  Follow up in 1 month to discuss refills of xanax and norco.

## 2013-02-28 NOTE — Telephone Encounter (Signed)
Pt seen today.  Pt needs Rx printed for Nexium 40 mg with RF x 1 year.  [facility].  Thank you.  Rosaleen Mazer, Darlyne RussianKristen L, CMA

## 2013-02-28 NOTE — Patient Instructions (Signed)
It was nice to see you!  Things to do to Keep yourself Healthy - Exercise at least 30-45 minutes a day,  3-4 days a week.  - Eat a low-fat diet with lots of fruits and vegetables, up to 7-9 servings per day. - Seatbelts can save your life. Wear them always. - Smoke detectors on every level of your home, check batteries every year. - Eye Doctor - have an eye exam every 1-2 years - Safe sex - if you may be exposed to STDs, use a condom. - Alcohol If you drink, do it moderately,less than 2 drinks per day. - Health Care Power of Attorney.  Choose someone to speak for you if you are not able. - Depression is common in our stressful world.If you're feeling down or losing interest in things you normally enjoy, please come in for a visit. - Violence - If anyone is threatening or hurting you, please call immediately.  Follow up next month for alprazolam and norco refills.

## 2013-03-01 MED ORDER — MONTELUKAST SODIUM 10 MG PO TABS: 10.0000 mg | ORAL_TABLET | Freq: Every day | ORAL | Status: DC

## 2013-03-01 MED ORDER — ESOMEPRAZOLE MAGNESIUM 40 MG PO CPDR: 40.0000 mg | DELAYED_RELEASE_CAPSULE | Freq: Every day | ORAL | Status: DC

## 2013-03-01 MED ORDER — TRIAMTERENE-HCTZ 37.5-25 MG PO CAPS: 1.0000 | ORAL_CAPSULE | Freq: Every day | ORAL | Status: DC

## 2013-03-01 MED ORDER — LEVOTHYROXINE SODIUM 50 MCG PO TABS: 50.0000 ug | ORAL_TABLET | Freq: Every day | ORAL | Status: DC

## 2013-03-01 MED ORDER — ALBUTEROL SULFATE HFA 108 (90 BASE) MCG/ACT IN AERS: 2.0000 | INHALATION_SPRAY | Freq: Four times a day (QID) | RESPIRATORY_TRACT | Status: DC | PRN

## 2013-03-01 MED ORDER — GABAPENTIN 300 MG PO CAPS: 300.0000 mg | ORAL_CAPSULE | Freq: Every day | ORAL | Status: DC

## 2013-03-01 MED ORDER — FEXOFENADINE HCL 180 MG PO TABS: 180.0000 mg | ORAL_TABLET | Freq: Every day | ORAL | Status: DC

## 2013-03-01 MED ORDER — CITALOPRAM HYDROBROMIDE 20 MG PO TABS: 20.0000 mg | ORAL_TABLET | Freq: Every day | ORAL | Status: DC

## 2013-03-01 MED ORDER — MELOXICAM 15 MG PO TABS: 15.0000 mg | ORAL_TABLET | Freq: Every day | ORAL | Status: DC

## 2013-03-01 MED ORDER — CETIRIZINE HCL 10 MG PO TABS: 10.0000 mg | ORAL_TABLET | Freq: Every day | ORAL | Status: DC

## 2013-03-01 MED ORDER — MECLIZINE HCL 25 MG PO TABS: 25.0000 mg | ORAL_TABLET | Freq: Three times a day (TID) | ORAL | Status: DC | PRN

## 2013-03-01 MED ORDER — VALACYCLOVIR HCL 500 MG PO TABS: 500.0000 mg | ORAL_TABLET | Freq: Every day | ORAL | Status: DC

## 2013-03-01 NOTE — Telephone Encounter (Signed)
Form and Rx mailed to patient.  Pt notified.  Luiza Carranco, Darlyne RussianKristen L, CMA

## 2013-03-01 NOTE — Telephone Encounter (Signed)
Patient called this morning. Would like paperwork and RX for Nexium that [facility] mailed back to her so she can send the application off.

## 2013-03-01 NOTE — Telephone Encounter (Signed)
I have printed the nexium prescription and [facility].

## 2013-03-07 ENCOUNTER — Telehealth: Payer: Self-pay | Admitting: Emergency Medicine

## 2013-03-07 MED ORDER — VALACYCLOVIR HCL 500 MG PO TABS: 500.0000 mg | ORAL_TABLET | Freq: Every day | ORAL | Status: DC

## 2013-03-07 NOTE — Telephone Encounter (Signed)
Pt will not be getting valacyclovir from [facility] mail ord[facility]. Needs it call in to [facility] [phone] If this could be done by Monday, that would be good.

## 2013-03-07 NOTE — Telephone Encounter (Signed)
[facility].

## 2013-03-07 NOTE — Telephone Encounter (Signed)
Will fwd. To PCP for refills. Thanks. .Jing Howatt  

## 2013-03-07 NOTE — Telephone Encounter (Signed)
Called pt. Informed. .Chioma Mukherjee  

## 2013-03-12 ENCOUNTER — Telehealth: Payer: Self-pay | Admitting: Emergency Medicine

## 2013-03-12 NOTE — Telephone Encounter (Signed)
Will fwd. To Dr.Honig for review. .Dalana Pfahler  

## 2013-03-12 NOTE — Telephone Encounter (Signed)
Patient calls very upset that her dosage of her gabapentin was changed from 3 x daily to once at bedtime. Patient states that it is not fair that the MD can just change this without even discussing with her. Would like to speak to Dr. Piedad ClimesHonig about this matter to discuss why exactly she changed it. She states she will not accept anything less that taking it twice daily. She states she has been on this medicine for years and does not understand why her dosage has been reduced. Please be advised.

## 2013-03-13 MED ORDER — GABAPENTIN 300 MG PO CAPS: 300.0000 mg | ORAL_CAPSULE | Freq: Three times a day (TID) | ORAL | Status: DC

## 2013-03-13 NOTE — Telephone Encounter (Signed)
Pt notified.  Irie Fiorello L, CMA  

## 2013-03-13 NOTE — Telephone Encounter (Signed)
This was an error.  The medication was entered as once daily so when I refilled it, it was only for 30 tablets per month.  I apologize for the mix up and have resent the correct prescription for gabapentin 300mg  TID to [facility].

## 2013-03-18 ENCOUNTER — Telehealth: Payer: Self-pay | Admitting: *Deleted

## 2013-03-18 NOTE — Telephone Encounter (Signed)
I have filled out the form and placed in "to be faxed" box.

## 2013-03-18 NOTE — Telephone Encounter (Signed)
[facility] faxed a clarification form for Gabapentin 300mg .  Please clarify directions.   Rx request in PCP box for review. Clovis PuMartin, Tamika L, RN

## 2013-03-22 ENCOUNTER — Other Ambulatory Visit: Payer: Self-pay | Admitting: Emergency Medicine

## 2013-03-22 MED ORDER — ESOMEPRAZOLE MAGNESIUM 40 MG PO CPDR: 40.0000 mg | DELAYED_RELEASE_CAPSULE | Freq: Every day | ORAL | Status: DC

## 2013-03-29 ENCOUNTER — Telehealth: Payer: Self-pay | Admitting: *Deleted

## 2013-03-29 NOTE — Telephone Encounter (Signed)
Pt called back this AM and stated she received all the forms in the mail yesterday after she had spoken with me.  Please discard message below.  Clovis PuMartin, Lenis Nettleton L, RN

## 2013-03-29 NOTE — Telephone Encounter (Signed)
Pt called requesting PCP complete an application ([employer]) to receive her Nexium for free.  Pt stated she also needs a new Rx for a nexium, a 90 day supply for 1 year.  Once PCP completes the form, she would like a phone call to know it is completed and form mailed to her.  Pt stated she will completed the rest of the form once she has received all information from her PCP.  Application placed in PCP box for review.  Clovis PuMartin, Dontavis Tschantz L, RN

## 2013-03-29 NOTE — Telephone Encounter (Signed)
Form completed and placed up front for pick-up. 

## 2013-04-01 ENCOUNTER — Ambulatory Visit: Payer: Medicare HMO | Admitting: Emergency Medicine

## 2013-04-12 ENCOUNTER — Ambulatory Visit: Payer: Medicare HMO | Admitting: Emergency Medicine

## 2013-04-18 ENCOUNTER — Ambulatory Visit (INDEPENDENT_AMBULATORY_CARE_PROVIDER_SITE_OTHER): Payer: Medicare HMO | Admitting: Emergency Medicine

## 2013-04-18 ENCOUNTER — Telehealth: Payer: Self-pay | Admitting: *Deleted

## 2013-04-18 ENCOUNTER — Encounter: Payer: Self-pay | Admitting: Emergency Medicine

## 2013-04-18 VITALS — BP 124/78 | HR 72 | Ht 62.0 in | Wt 189.0 lb

## 2013-04-18 DIAGNOSIS — H8109 Meniere's disease, unspecified ear: Secondary | ICD-10-CM

## 2013-04-18 MED ORDER — ALPRAZOLAM 0.5 MG PO TABS
0.5000 mg | ORAL_TABLET | Freq: Three times a day (TID) | ORAL | Status: DC | PRN
Start: 2013-04-18 — End: 2013-05-30

## 2013-04-18 NOTE — Patient Instructions (Signed)
It was nice to see you!  You can go ahead and get either of the blood pressure cuffs. Please bring the cuff into our office for a nurse appointment so we can make sure it is calibrated appropriately.  I have refilled the xanax for 1 month.    Follow up in 1 month for a refill.  If everything goes well, we will change to a 2557-month prescription cycle.

## 2013-04-18 NOTE — Assessment & Plan Note (Signed)
Uses xanax to help with vertigo. Signed controlled substance agreement today. Refilled xanax 0.5mg  TID prn #90. Follow up in 1 month for refill - if everything goes well, will go to 3 month prescriptions.

## 2013-04-18 NOTE — Telephone Encounter (Signed)
Pt request: 1.) letter that allows her to go back to her gym 2.) [facility] BP machine. Fax # [phone] Will fwd to PCP. I will fax letter upon completion and call the pt to pick up letter for gym. Lorenda Hatchet.Arrion Burruel, Renato Battleshekla

## 2013-04-18 NOTE — Progress Notes (Signed)
   Subjective:    Patient ID: Jenna Garner, female    DOB: 10/21/1955, 58 y.o.   MRN: [id number]  HPI Jenna DecampJoan Scavone is here for med refill.  She takes xanax 2-3 times a day for meniere's related dizziness.  She has been on this medication for many years with good results.  She states her dizzy spells typically flare with strong emotions and anxiety.   Current Outpatient Prescriptions on File Prior to Visit  Medication Sig Dispense Refill  . albuterol (PROAIR HFA) 108 (90 BASE) MCG/ACT inhaler Inhale 2 puffs into the lungs every 6 (six) hours as needed for wheezing.  1 Inhaler  3  . carisoprodol (SOMA) 350 MG tablet Take 1 tablet (350 mg total) by mouth 2 (two) times daily as needed for muscle spasms.  30 tablet  3  . cetirizine (ZYRTEC) 10 MG tablet Take 1 tablet (10 mg total) by mouth daily.  90 tablet  3  . citalopram (CELEXA) 20 MG tablet Take 1 tablet (20 mg total) by mouth daily.  90 tablet  3  . esomeprazole (NEXIUM) 40 MG capsule Take 1 capsule (40 mg total) by mouth at bedtime.  90 capsule  3  . fexofenadine (ALLEGRA) 180 MG tablet Take 1 tablet (180 mg total) by mouth daily.  90 tablet  3  . gabapentin (NEURONTIN) 300 MG capsule Take 1 capsule (300 mg total) by mouth 3 (three) times daily. Take 1 at bedtime  270 capsule  3  . HYDROcodone-acetaminophen (NORCO/VICODIN) 5-325 MG per tablet Take 1 tablet by mouth every 6 (six) hours as needed for moderate pain.      Marland Kitchen. levothyroxine (SYNTHROID, LEVOTHROID) 50 MCG tablet Take 1 tablet (50 mcg total) by mouth daily before breakfast. Take 1 by mouth daily  90 tablet  3  . meclizine (ANTIVERT) 25 MG tablet Take 1 tablet (25 mg total) by mouth 3 (three) times daily as needed for dizziness.  30 tablet  3  . meloxicam (MOBIC) 15 MG tablet Take 1 tablet (15 mg total) by mouth daily.  90 tablet  3  . montelukast (SINGULAIR) 10 MG tablet Take 1 tablet (10 mg total) by mouth at bedtime. Take 1 by mouth daily  90 tablet  3  . ondansetron (ZOFRAN) 4 MG tablet  Take 4 mg by mouth every 8 (eight) hours as needed for nausea or vomiting.      . triamterene-hydrochlorothiazide (DYAZIDE) 37.5-25 MG per capsule Take 1 each (1 capsule total) by mouth daily.  90 capsule  3  . valACYclovir (VALTREX) 500 MG tablet Take 1 tablet (500 mg total) by mouth daily.  90 tablet  3   No current facility-administered medications on file prior to visit.    I have reviewed and updated the following as appropriate: allergies and current medications SHx: non smoker  Review of Systems See HPI    Objective:   Physical Exam BP 124/78  Pulse 72  Ht 5\' 2"  (1.575 m)  Wt 189 lb (85.73 kg)  BMI 34.56 kg/m2 Gen: alert, cooperative, NAD       Assessment & Plan:

## 2013-04-19 NOTE — Telephone Encounter (Signed)
Called pt. Informed of the letters. Pt still request for PCP to call the number for [facility] (?) I told the pt, that I will mail both letters to her and she can mail the letter to the right destination. Pt agreed. Letters mailed. Jenna Garner.Jenna Garner, Jenna Garner

## 2013-04-19 NOTE — Telephone Encounter (Signed)
Letters printed and given to [employer] [facility].

## 2013-05-02 ENCOUNTER — Encounter: Payer: Self-pay | Admitting: Emergency Medicine

## 2013-05-21 ENCOUNTER — Ambulatory Visit: Payer: Medicare HMO | Admitting: Emergency Medicine

## 2013-05-30 ENCOUNTER — Encounter: Payer: Self-pay | Admitting: Emergency Medicine

## 2013-05-30 ENCOUNTER — Ambulatory Visit (INDEPENDENT_AMBULATORY_CARE_PROVIDER_SITE_OTHER): Payer: Commercial Managed Care - HMO | Admitting: Emergency Medicine

## 2013-05-30 VITALS — BP 136/83 | HR 66 | Temp 98.2°F | Ht 62.0 in | Wt 190.0 lb

## 2013-05-30 DIAGNOSIS — H8109 Meniere's disease, unspecified ear: Secondary | ICD-10-CM

## 2013-05-30 DIAGNOSIS — M6740 Ganglion, unspecified site: Secondary | ICD-10-CM

## 2013-05-30 DIAGNOSIS — M67439 Ganglion, unspecified wrist: Secondary | ICD-10-CM | POA: Insufficient documentation

## 2013-05-30 MED ORDER — ALPRAZOLAM 0.5 MG PO TABS: 0.5000 mg | ORAL_TABLET | Freq: Three times a day (TID) | ORAL | Status: DC | PRN

## 2013-05-30 NOTE — Progress Notes (Signed)
Forward to [employer], patient is to be referred to [place] hand specialist in [place].Jenna Cheadleobert L Zipporah Garner

## 2013-05-30 NOTE — Progress Notes (Signed)
Will fwd to Md, pt was seen today.  Radene OuKristen L Ashby Moskal, CMA

## 2013-05-30 NOTE — Progress Notes (Signed)
Patient says that she definitely wants to be referred to dr in [place].

## 2013-05-30 NOTE — Assessment & Plan Note (Signed)
Stable.  Refilled xanax 0.5mg  #90 with 3 refills. F/u in 3 months.

## 2013-05-30 NOTE — Progress Notes (Signed)
Subjective:    Patient ID: Jenna Garner, female    DOB: 07/07/1955, 58 y.o.   MRN: [id number]  HPI Jenna DecampJoan Killings is here for med refill.  Med refill She is on xanax for vertigo associated with Meniere's disease.  She will take 1-2.5 tablets TID as needed for vertigo.  She does not take it every day.  Bump on wrist She reports a bump on her right wrist for about the last year.  States it causes her discomfort and she would like to do something about it.  Denies any redness or drainage.  Does have a history of carpal tunnel and will wear wrist braces at night.  Current Outpatient Prescriptions on File Prior to Visit  Medication Sig Dispense Refill  . albuterol (PROAIR HFA) 108 (90 BASE) MCG/ACT inhaler Inhale 2 puffs into the lungs every 6 (six) hours as needed for wheezing.  1 Inhaler  3  . carisoprodol (SOMA) 350 MG tablet Take 1 tablet (350 mg total) by mouth 2 (two) times daily as needed for muscle spasms.  30 tablet  3  . cetirizine (ZYRTEC) 10 MG tablet Take 1 tablet (10 mg total) by mouth daily.  90 tablet  3  . citalopram (CELEXA) 20 MG tablet Take 1 tablet (20 mg total) by mouth daily.  90 tablet  3  . esomeprazole (NEXIUM) 40 MG capsule Take 1 capsule (40 mg total) by mouth at bedtime.  90 capsule  3  . fexofenadine (ALLEGRA) 180 MG tablet Take 1 tablet (180 mg total) by mouth daily.  90 tablet  3  . gabapentin (NEURONTIN) 300 MG capsule Take 1 capsule (300 mg total) by mouth 3 (three) times daily. Take 1 at bedtime  270 capsule  3  . HYDROcodone-acetaminophen (NORCO/VICODIN) 5-325 MG per tablet Take 1 tablet by mouth every 6 (six) hours as needed for moderate pain.      Marland Kitchen. levothyroxine (SYNTHROID, LEVOTHROID) 50 MCG tablet Take 1 tablet (50 mcg total) by mouth daily before breakfast. Take 1 by mouth daily  90 tablet  3  . meclizine (ANTIVERT) 25 MG tablet Take 1 tablet (25 mg total) by mouth 3 (three) times daily as needed for dizziness.  30 tablet  3  . meloxicam (MOBIC) 15 MG tablet  Take 1 tablet (15 mg total) by mouth daily.  90 tablet  3  . montelukast (SINGULAIR) 10 MG tablet Take 1 tablet (10 mg total) by mouth at bedtime. Take 1 by mouth daily  90 tablet  3  . ondansetron (ZOFRAN) 4 MG tablet Take 4 mg by mouth every 8 (eight) hours as needed for nausea or vomiting.      . triamterene-hydrochlorothiazide (DYAZIDE) 37.5-25 MG per capsule Take 1 each (1 capsule total) by mouth daily.  90 capsule  3  . valACYclovir (VALTREX) 500 MG tablet Take 1 tablet (500 mg total) by mouth daily.  90 tablet  3   No current facility-administered medications on file prior to visit.    I have reviewed and updated the following as appropriate: allergies and current medications SHx: non smoker   Review of Systems See HPI    Objective:   Physical Exam  Musculoskeletal:       Arms:  BP 136/83  Pulse 66  Temp(Src) 98.2 F (36.8 C) (Oral)  Ht 5\' 2"  (1.575 m)  Wt 190 lb (86.183 kg)  BMI 34.74 kg/m2 Gen: alert, cooperative, NAD Right wrist: no erythema or edema, full AROM, 1cm mildly tender mobile  cyst over radial volar wrist     Assessment & Plan:

## 2013-05-30 NOTE — Patient Instructions (Signed)
It was nice to see you!  You should get a call in the next 2 weeks about the referral to hand surgery for the cyst on your wrist.  If you don't hear anything, please give me a call so I can figure out what the hold up is.  Follow up in 3 months or sooner as needed.

## 2013-05-30 NOTE — Assessment & Plan Note (Signed)
Exam consistent with ganglion cyst. Symptomatic for patient.  Given location, will refer to hand surgery for possible drainage/injection vs surgical removal.

## 2013-06-04 ENCOUNTER — Telehealth: Payer: Self-pay | Admitting: Emergency Medicine

## 2013-06-04 MED ORDER — IPRATROPIUM BROMIDE 0.06 % NA SOLN
2.0000 | Freq: Four times a day (QID) | NASAL | Status: DC
Start: 2013-06-04 — End: 2013-09-19

## 2013-06-04 NOTE — Telephone Encounter (Signed)
Called pt. Informed. .Jenna Garner  

## 2013-06-04 NOTE — Telephone Encounter (Signed)
Prescription sent to requested pharmacy. 

## 2013-06-04 NOTE — Telephone Encounter (Signed)
Pt called because at her last visit Dr. Piedad ClimesHonig talked about a nasal spray that was older that would help her. She would like this called in to [facility] [place] Jw

## 2013-06-13 ENCOUNTER — Telehealth: Payer: Self-pay | Admitting: Emergency Medicine

## 2013-06-13 MED ORDER — ALBUTEROL SULFATE HFA 108 (90 BASE) MCG/ACT IN AERS: 2.0000 | INHALATION_SPRAY | Freq: Four times a day (QID) | RESPIRATORY_TRACT | Status: DC | PRN

## 2013-06-13 NOTE — Telephone Encounter (Signed)
Needs refill on albuterol inhaler

## 2013-06-13 NOTE — Telephone Encounter (Signed)
Refill sent to pharmacy on file

## 2013-06-18 NOTE — Progress Notes (Signed)
Referral faxed to [facility].  Pt is aware about appt.  07/11/2013@3 :15  [employer]

## 2013-07-15 ENCOUNTER — Telehealth: Payer: Self-pay | Admitting: Emergency Medicine

## 2013-07-15 NOTE — Telephone Encounter (Signed)
Forward to PCP for refill.Robert L Busick  

## 2013-07-15 NOTE — Telephone Encounter (Signed)
Pt called and needs a refill of Zofran sent to [facility] . jw

## 2013-07-16 ENCOUNTER — Other Ambulatory Visit: Payer: Self-pay | Admitting: Emergency Medicine

## 2013-07-16 MED ORDER — ONDANSETRON HCL 4 MG PO TABS: 4.0000 mg | ORAL_TABLET | Freq: Three times a day (TID) | ORAL | Status: DC | PRN

## 2013-07-16 NOTE — Telephone Encounter (Signed)
Refill of zofran sent to pharmacy.

## 2013-07-16 NOTE — Telephone Encounter (Signed)
Called patient and informed her refill was sent to her pharmacy. 90 day supply to the mail order pharmacy per patient's request.Jenna Garner L Caren Garske

## 2013-07-19 ENCOUNTER — Telehealth: Payer: Self-pay | Admitting: Emergency Medicine

## 2013-07-19 NOTE — Telephone Encounter (Signed)
Pt called because the Zofran has to be worded differently for her insurance to cover it for free. It must be 90 qty but state that this a a 90 day supply. It must say this on the prescription for them to cover this. Also make sure that it is a 6 month or 9 month or even 12 month prescription. Please call [facility] so that she can get her medication. jw

## 2013-07-23 ENCOUNTER — Telehealth: Payer: Self-pay | Admitting: Emergency Medicine

## 2013-07-23 NOTE — Telephone Encounter (Signed)
[facility] be receiving [facility] about getting a generic for zofran Should be a 90 day supply

## 2013-07-24 ENCOUNTER — Other Ambulatory Visit: Payer: Self-pay | Admitting: *Deleted

## 2013-07-24 MED ORDER — ONDANSETRON HCL 4 MG PO TABS: 4.0000 mg | ORAL_TABLET | Freq: Three times a day (TID) | ORAL | Status: DC | PRN

## 2013-07-24 NOTE — Telephone Encounter (Signed)
I sent an electronic prescription for generic zofran, 90 day supply.

## 2013-08-07 NOTE — Telephone Encounter (Signed)
RX for zofran needs to be for a 90 day supply rather than 30.  If she gets a ninety day supply, it is free. For 30 day it is $1600. Please advise

## 2013-09-04 ENCOUNTER — Ambulatory Visit: Payer: Medicare HMO | Admitting: Family Medicine

## 2013-09-19 ENCOUNTER — Ambulatory Visit (INDEPENDENT_AMBULATORY_CARE_PROVIDER_SITE_OTHER): Payer: Commercial Managed Care - HMO | Admitting: Family Medicine

## 2013-09-19 VITALS — BP 155/92 | HR 82 | Temp 98.0°F | Ht 62.0 in | Wt 187.5 lb

## 2013-09-19 DIAGNOSIS — R030 Elevated blood-pressure reading, without diagnosis of hypertension: Secondary | ICD-10-CM

## 2013-09-19 DIAGNOSIS — K589 Irritable bowel syndrome without diarrhea: Secondary | ICD-10-CM

## 2013-09-19 DIAGNOSIS — IMO0001 Reserved for inherently not codable concepts without codable children: Secondary | ICD-10-CM | POA: Insufficient documentation

## 2013-09-19 DIAGNOSIS — E785 Hyperlipidemia, unspecified: Secondary | ICD-10-CM

## 2013-09-19 DIAGNOSIS — E039 Hypothyroidism, unspecified: Secondary | ICD-10-CM

## 2013-09-19 LAB — LIPID PANEL
Cholesterol: 238 mg/dL — ABNORMAL HIGH (ref 0–200)
HDL: 44 mg/dL (ref >39)
LDL CALC: 136 mg/dL — AB (ref 0–99)
Total CHOL/HDL Ratio: 5.4 Ratio
Triglycerides: 289 mg/dL — ABNORMAL HIGH (ref <150)
VLDL: 58 mg/dL — AB (ref 0–40)

## 2013-09-19 LAB — COMPREHENSIVE METABOLIC PANEL
ALK PHOS: 73 U/L (ref 39–117)
ALT: 19 U/L (ref 0–35)
AST: 22 U/L (ref 0–37)
Albumin: 4.6 g/dL (ref 3.5–5.2)
BUN: 11 mg/dL (ref 6–23)
CO2: 25 mEq/L (ref 19–32)
Calcium: 9.6 mg/dL (ref 8.4–10.5)
Chloride: 90 mEq/L — ABNORMAL LOW (ref 96–112)
Creat: 1.02 mg/dL (ref 0.50–1.10)
Glucose, Bld: 91 mg/dL (ref 70–99)
Potassium: 4.2 mEq/L (ref 3.5–5.3)
SODIUM: 126 meq/L — AB (ref 135–145)
TOTAL PROTEIN: 7.1 g/dL (ref 6.0–8.3)
Total Bilirubin: 0.6 mg/dL (ref 0.2–1.2)

## 2013-09-19 NOTE — Assessment & Plan Note (Signed)
Check lipids and CMET today 

## 2013-09-19 NOTE — Patient Instructions (Signed)
It was nice to meet you today!  I'm not sure what caused your abdominal pain but I am glad it is getting better. Return if it worsens, you begin vomiting, or have any changes in your bowels.  We are checking bloodwork today. I will call you if your test results are not normal.  Otherwise, I will send you a letter.  If you do not hear from me with in 2 weeks please call our office.      Follow up in 3-4 weeks to recheck your blood pressure and ensure it is not continuing to be high.  Be well, Dr. Pollie MeyerMcIntyre

## 2013-09-19 NOTE — Progress Notes (Signed)
Patient ID: Jenna Garner, female   DOB: 09/01/1955, 58 y.o.   MRN: [id number]  HPI:  Pt presents today to meet her new doctor, also to discuss abdominal pain. Started having abd pain about 4 days ago on Sunday evening. She had taken a dose of norco earlier in the day. Had generalized epigastric pain that got progressively worse. Started feeling a lot better today although is still somewhat tender. No vomiting but felt nauseated yesterday. Has BM every day, without blood or dark/tarry features. No new medicines recently. Eating and drinking normally now, urinating fine. Does endorse a hx of IBS.   ROS: See HPI.  PMFSH: hx GERD, IBS, asthma, depression, HLD, migraines, hypothyroidism, genital herpes, PTSD, OSA  PHYSICAL EXAM: BP 155/92  Pulse 82  Temp(Src) 98 F (36.7 C) (Oral)  Ht 5\' 2"  (1.575 m)  Wt 187 lb 8 oz (85.049 kg)  BMI 34.29 kg/m2 Gen: NAD HEENT: NCAT Heart: RRR Lungs: CTAB Abd: soft nontender to palpation no masses or organomegaly Neuro: grossly nonfocal speech normal  ASSESSMENT/PLAN:  Hypothyroid Check TSH today  Hyperlipidemia Check lipids and CMET today  IRRITABLE BOWEL SYNDROME Abd exam benign today, unclear etiology for the pain she had. In any case it is improving, and I recommended we just watch and wait. Will check CMET since we are getting labs today anyways.  Elevated BP BP elevated today, initially 141/78, then 160/92, then 155/92. Will have her return in a few weeks for a BP recheck. If remains elevated will need to discuss starting BP medicine (note pt is on triamterene HCTZ for menieres disease, not HTN).  FOLLOW UP: F/u in a few weeks for BP check.  Jenna J. Pollie MeyerMcIntyre, MD [facility]

## 2013-09-19 NOTE — Assessment & Plan Note (Signed)
Abd exam benign today, unclear etiology for the pain she had. In any case it is improving, and I recommended we just watch and wait. Will check CMET since we are getting labs today anyways.

## 2013-09-19 NOTE — Assessment & Plan Note (Signed)
BP elevated today, initially 141/78, then 160/92, then 155/92. Will have her return in a few weeks for a BP recheck. If remains elevated will need to discuss starting BP medicine (note pt is on triamterene HCTZ for menieres disease, not HTN).

## 2013-09-19 NOTE — Assessment & Plan Note (Signed)
Check TSH today

## 2013-09-20 LAB — TSH: TSH: 4.796 u[IU]/mL — ABNORMAL HIGH (ref 0.350–4.500)

## 2013-09-21 ENCOUNTER — Emergency Department: Payer: Self-pay | Admitting: Emergency Medicine

## 2013-09-21 LAB — URINALYSIS, COMPLETE
BILIRUBIN, UR: NEGATIVE
Blood: NEGATIVE
Glucose,UR: NEGATIVE mg/dL (ref 0–75)
KETONE: NEGATIVE
Nitrite: POSITIVE
Ph: 7 (ref 4.5–8.0)
Protein: NEGATIVE
Specific Gravity: 1.005 (ref 1.003–1.030)

## 2013-09-21 LAB — CBC
HCT: 38.6 % (ref 35.0–47.0)
HGB: 13.5 g/dL (ref 12.0–16.0)
MCH: 33.3 pg (ref 26.0–34.0)
MCHC: 35.1 g/dL (ref 32.0–36.0)
MCV: 95 fL (ref 80–100)
PLATELETS: 270 10*3/uL (ref 150–440)
RBC: 4.07 10*6/uL (ref 3.80–5.20)
RDW: 12.4 % (ref 11.5–14.5)
WBC: 7.4 10*3/uL (ref 3.6–11.0)

## 2013-09-21 LAB — BASIC METABOLIC PANEL
ANION GAP: 12 (ref 7–16)
BUN: 10 mg/dL (ref 7–18)
CALCIUM: 9 mg/dL (ref 8.5–10.1)
Chloride: 92 mmol/L — ABNORMAL LOW (ref 98–107)
Co2: 26 mmol/L (ref 21–32)
Creatinine: 1.07 mg/dL (ref 0.60–1.30)
GFR CALC NON AF AMER: 58 — AB
Glucose: 110 mg/dL — ABNORMAL HIGH (ref 65–99)
Osmolality: 260 (ref 275–301)
Potassium: 3.8 mmol/L (ref 3.5–5.1)
SODIUM: 130 mmol/L — AB (ref 136–145)

## 2013-09-22 LAB — URINE CULTURE

## 2013-09-27 ENCOUNTER — Telehealth: Payer: Self-pay | Admitting: Family Medicine

## 2013-09-27 ENCOUNTER — Other Ambulatory Visit: Payer: Self-pay | Admitting: Family Medicine

## 2013-09-27 DIAGNOSIS — Z1231 Encounter for screening mammogram for malignant neoplasm of breast: Secondary | ICD-10-CM

## 2013-09-27 NOTE — Telephone Encounter (Signed)
Appointment scheduled with the breast center for 9/8 at 9am, patient informed, she will call to reschedule this appointment at a more convenient time for her.

## 2013-09-27 NOTE — Telephone Encounter (Signed)
Pt called wanted to know about her referral to have a mammogram. jw

## 2013-09-30 ENCOUNTER — Telehealth: Payer: Self-pay | Admitting: Family Medicine

## 2013-09-30 NOTE — Telephone Encounter (Signed)
Appt scheduled for 9/22 at 3pm, patient informed.

## 2013-09-30 NOTE — Telephone Encounter (Signed)
Pt would like the mammogram referral to [facility]. jw

## 2013-10-03 ENCOUNTER — Encounter: Payer: Self-pay | Admitting: Family Medicine

## 2013-10-03 ENCOUNTER — Telehealth: Payer: Self-pay | Admitting: Family Medicine

## 2013-10-03 DIAGNOSIS — E871 Hypo-osmolality and hyponatremia: Secondary | ICD-10-CM | POA: Insufficient documentation

## 2013-10-03 MED ORDER — LEVOTHYROXINE SODIUM 50 MCG PO TABS: 75.0000 ug | ORAL_TABLET | Freq: Every day | ORAL | Status: DC

## 2013-10-03 NOTE — Telephone Encounter (Signed)
Called pt to discuss lab results.  TSH was mildly elevated, discussed need to increase dose of synthroid from daily to 75 mcg daily. She prefers to take 1.5 tablets for now instead of me sending in a new rx due to financial concerns.  Also discussed chronically low sodium. Mildly improved from last year. Possibly related to HCTZ. She is asymptomatic and prefers not to make any changes now. Will plan to periodically check in the future. Precepted this decision with Dr. Jennette Kettle who agrees.  Also discussed mildly elevated cholesterol, although note was not a fasting lab draw. Will plan to discuss risk factor reduction when she follows up with me for a BP check in September.  Of note the day after I saw her she had to go to urgent care and then the ER with a UTI/pyelo. She has been on antibiotics and is now feeling much better.  Will look forward to seeing her in September.  Latrelle Dodrill, MD

## 2013-10-14 ENCOUNTER — Emergency Department: Payer: Self-pay | Admitting: Emergency Medicine

## 2013-10-14 LAB — URINALYSIS, COMPLETE
BACTERIA: NONE SEEN
Bilirubin,UR: NEGATIVE
Blood: NEGATIVE
GLUCOSE, UR: NEGATIVE mg/dL (ref 0–75)
Ketone: NEGATIVE
Leukocyte Esterase: NEGATIVE
Nitrite: NEGATIVE
PROTEIN: NEGATIVE
Ph: 8 (ref 4.5–8.0)
RBC,UR: <1 /HPF (ref 0–5)
SPECIFIC GRAVITY: 1.004 (ref 1.003–1.030)
WBC UR: NONE SEEN /HPF (ref 0–5)

## 2013-10-14 LAB — CBC WITH DIFFERENTIAL/PLATELET
BASOS PCT: 0.5 %
Basophil #: 0 10*3/uL (ref 0.0–0.1)
EOS ABS: 0 10*3/uL (ref 0.0–0.7)
Eosinophil %: 0.4 %
HCT: 38.5 % (ref 35.0–47.0)
HGB: 13.2 g/dL (ref 12.0–16.0)
Lymphocyte #: 1.6 10*3/uL (ref 1.0–3.6)
Lymphocyte %: 25.5 %
MCH: 33 pg (ref 26.0–34.0)
MCHC: 34.3 g/dL (ref 32.0–36.0)
MCV: 96 fL (ref 80–100)
MONOS PCT: 4.3 %
Monocyte #: 0.3 x10 3/mm (ref 0.2–0.9)
NEUTROS ABS: 4.5 10*3/uL (ref 1.4–6.5)
Neutrophil %: 69.3 %
Platelet: 265 10*3/uL (ref 150–440)
RBC: 4 10*6/uL (ref 3.80–5.20)
RDW: 13.1 % (ref 11.5–14.5)
WBC: 6.4 10*3/uL (ref 3.6–11.0)

## 2013-10-14 LAB — COMPREHENSIVE METABOLIC PANEL
ALBUMIN: 4 g/dL (ref 3.4–5.0)
ALK PHOS: 76 U/L
ALT: 27 U/L
Anion Gap: 9 (ref 7–16)
BUN: 11 mg/dL (ref 7–18)
Bilirubin,Total: 0.8 mg/dL (ref 0.2–1.0)
Calcium, Total: 9.3 mg/dL (ref 8.5–10.1)
Chloride: 94 mmol/L — ABNORMAL LOW (ref 98–107)
Co2: 27 mmol/L (ref 21–32)
Creatinine: 1.14 mg/dL (ref 0.60–1.30)
EGFR (African American): >60
GFR CALC NON AF AMER: 53 — AB
GLUCOSE: 131 mg/dL — AB (ref 65–99)
OSMOLALITY: 262 (ref 275–301)
POTASSIUM: 3.5 mmol/L (ref 3.5–5.1)
SGOT(AST): 29 U/L (ref 15–37)
SODIUM: 130 mmol/L — AB (ref 136–145)
Total Protein: 7.3 g/dL (ref 6.4–8.2)

## 2013-10-14 LAB — TROPONIN I

## 2013-10-14 LAB — LIPASE, BLOOD: Lipase: 140 U/L (ref 73–393)

## 2013-10-15 ENCOUNTER — Ambulatory Visit: Payer: Commercial Managed Care - HMO

## 2013-10-15 ENCOUNTER — Telehealth: Payer: Self-pay | Admitting: Family Medicine

## 2013-10-15 NOTE — Telephone Encounter (Signed)
Pt called and would like Dr. Pollie Meyer to call her concerning her ER visits and the medication that was increased. jw

## 2013-10-15 NOTE — Telephone Encounter (Signed)
Jenna Garner is a 58 y.o. female patient calls into after hours line to discuss recent visit to [facility] ED over the weekend. Patient has many complaints about the care she had at [facility]. She states she went into the ED Sunday night for nausea. They held her until Monday morning and "did not give a diagnosis."  She was given zofran on discharge. She reports she has been able to tolerate sips and 3 ginger snaps, but this made her nauseated he states she has not been able to take all her medications and then states she has not vomited at all. So I am uncertain why she has not taken the rest of her medications. She endorses being able to tolerate her xanax, singular and zyrtec today. Patient is moderately tangential in speech and needs to be redirected frequently. She reports she is unable to split her thyroid medication has Dr. Pollie Meyer wanted her to do to get to .  She denies fever or vomit. She endorses uncomfortableness in her abdomen and diarrhea. She has no known sick contacts.  A/P: Likely viral gastroenteritis, possibly could be increased synthroid dose causing diarrhea.  - Advised pt to take her medications as prescribed. If she is inable t tolerate mediations, fluids or she develops worsenin abdominal pain, fever or vomit she is to go to an Urgent care. Patient states she can not afford it.  - advised patient to call clinic in morning and schedule a SDA in the morning and someone will see her at the clinic, this should be cheaper for her. She was relieved and stated she would do that.  - Advised pt to continue zofran as directed Claiborne Billings, Dewan Emond DO

## 2013-10-15 NOTE — Telephone Encounter (Signed)
Red team, please call pt and get more info about why she needs to speak with me. Latrelle Dodrill, MD

## 2013-10-16 NOTE — Telephone Encounter (Signed)
Patient called after hours line (see additional telephone note from 9/8). Also spoke with patient today and she states that she decreased synthroid to due to the causing diarrhea. FYI to PCP.

## 2013-10-16 NOTE — Telephone Encounter (Signed)
Left message on voicemail for patient to call back with more information.

## 2013-10-25 ENCOUNTER — Ambulatory Visit (INDEPENDENT_AMBULATORY_CARE_PROVIDER_SITE_OTHER): Payer: Commercial Managed Care - HMO | Admitting: Family Medicine

## 2013-10-25 ENCOUNTER — Encounter: Payer: Self-pay | Admitting: Family Medicine

## 2013-10-25 VITALS — BP 136/85 | HR 71 | Temp 97.5°F | Ht 62.0 in | Wt 182.3 lb

## 2013-10-25 DIAGNOSIS — F32A Depression, unspecified: Secondary | ICD-10-CM

## 2013-10-25 DIAGNOSIS — R531 Weakness: Secondary | ICD-10-CM

## 2013-10-25 DIAGNOSIS — R5381 Other malaise: Secondary | ICD-10-CM

## 2013-10-25 DIAGNOSIS — E785 Hyperlipidemia, unspecified: Secondary | ICD-10-CM

## 2013-10-25 DIAGNOSIS — K589 Irritable bowel syndrome without diarrhea: Secondary | ICD-10-CM

## 2013-10-25 DIAGNOSIS — E039 Hypothyroidism, unspecified: Secondary | ICD-10-CM

## 2013-10-25 DIAGNOSIS — R030 Elevated blood-pressure reading, without diagnosis of hypertension: Secondary | ICD-10-CM

## 2013-10-25 DIAGNOSIS — F329 Major depressive disorder, single episode, unspecified: Secondary | ICD-10-CM

## 2013-10-25 DIAGNOSIS — F3289 Other specified depressive episodes: Secondary | ICD-10-CM

## 2013-10-25 DIAGNOSIS — R5383 Other fatigue: Secondary | ICD-10-CM

## 2013-10-25 DIAGNOSIS — IMO0001 Reserved for inherently not codable concepts without codable children: Secondary | ICD-10-CM

## 2013-10-25 LAB — COMPREHENSIVE METABOLIC PANEL
ALT: 16 U/L (ref 0–35)
AST: 21 U/L (ref 0–37)
Albumin: 4.6 g/dL (ref 3.5–5.2)
Alkaline Phosphatase: 71 U/L (ref 39–117)
BILIRUBIN TOTAL: 0.5 mg/dL (ref 0.2–1.2)
BUN: 12 mg/dL (ref 6–23)
CO2: 27 mEq/L (ref 19–32)
Calcium: 9.6 mg/dL (ref 8.4–10.5)
Chloride: 92 mEq/L — ABNORMAL LOW (ref 96–112)
Creat: 0.98 mg/dL (ref 0.50–1.10)
GLUCOSE: 84 mg/dL (ref 70–99)
Potassium: 3.9 mEq/L (ref 3.5–5.3)
Sodium: 127 mEq/L — ABNORMAL LOW (ref 135–145)
Total Protein: 6.8 g/dL (ref 6.0–8.3)

## 2013-10-25 LAB — CBC
HCT: 36.9 % (ref 36.0–46.0)
Hemoglobin: 12.9 g/dL (ref 12.0–15.0)
MCH: 31.8 pg (ref 26.0–34.0)
MCHC: 35 g/dL (ref 30.0–36.0)
MCV: 90.9 fL (ref 78.0–100.0)
PLATELETS: 292 10*3/uL (ref 150–400)
RBC: 4.06 MIL/uL (ref 3.87–5.11)
RDW: 13.7 % (ref 11.5–15.5)
WBC: 6.2 10*3/uL (ref 4.0–10.5)

## 2013-10-25 MED ORDER — ALPRAZOLAM 0.5 MG PO TABS: 0.5000 mg | ORAL_TABLET | Freq: Three times a day (TID) | ORAL | Status: DC | PRN

## 2013-10-25 NOTE — Assessment & Plan Note (Signed)
Discussed importance of low fat diet and working on exercise to lower cholesterol levels.

## 2013-10-25 NOTE — Assessment & Plan Note (Signed)
Anxiety seems well controlled at present. She is on xanax for both menieres and anxiety, also takes celexa. Requested refill of xanax today. Appropriate interval since last prescription. Denies having plans to harm herself or others. Refilled x 2 months.

## 2013-10-25 NOTE — Assessment & Plan Note (Signed)
Suspect overall continued intermittent diarrhea related to IBS. Generally improving after flare (vs. gastroenteritis) a few weeks ago. Since she endorses increased fatigue since this GI upset event, will check CBC and CMET today. F/u if not improving.

## 2013-10-25 NOTE — Assessment & Plan Note (Signed)
Pt prefers to stay on dose of synthroid as she strongly believes her diarrhea and nausea was due to taking for a few days. Will continue daily dose for now and plan to recheck TSH in 2 months.

## 2013-10-25 NOTE — Assessment & Plan Note (Signed)
BP normal today. Continue to monitor at future visits.

## 2013-10-25 NOTE — Patient Instructions (Addendum)
It was great to see you again today!  For thyroid: -we will recheck your thyroid lab in 2 months and adjust your medicines as needed at that time. -continue with the 50 mcg dose for now  Weakness: -checking some basic labs today  Anxiety: -refilled xanax -follow up to discuss this in 2 months  Schedule separate visit for your shoulder.  Be well, Dr. Pollie Meyer

## 2013-10-25 NOTE — Progress Notes (Signed)
Patient ID: Jenna Garner, female   DOB: 24-Nov-1955, 58 y.o.   MRN: [id number]   HPI:  Stomach problems: had a lot of diarrhea from increased dose of synthroid, also had menieres flare. Went to the ER via EMS, was in the ER for a long time, about 9 hours. Had CT abdomen which was reportedly normal. Had both nausea and diarrhea. Feeling somewhat better, but not great. No blood in her stool but did have dark stool when this all happened briefly. Now has occasional diarrhea. Has hx of diarrhea predominant IBS. Has taken probiotic since she recently took course of abx for UTI. Overall thinks she's getting better. Has felt weak since the episode of stomach illness, this is overall getting a little better.  Elevated BP: BP was elevated at last visit. On triamterene-HCTZ for meniere's disease, not HTN. Here for BP recheck today.  Anxiety: requests refill of xanax. Takes 0.5mg  2-3 times a day for anxiety and for her menieres disease. Does well with this medicine. Has to space it out with her meclizine to avoid sedation. Denies SI/HI.  ROS: See HPI.  PMFSH: hx GERD, IBS, asthma, depression, hyperlipidemia, migraines, hypothyroid, PTSD/dep/anxiety  PHYSICAL EXAM: BP 136/85  Pulse 71  Temp(Src) 97.5 F (36.4 C) (Oral)  Ht  (1.575 m)  Wt 182 lb 4.8 oz (82.691 kg)  BMI 33.33 kg/m2 Gen: NAD HEENT: NCAT Heart: RRR no murmurs Lungs: CTAB, NWOB Abd: soft, NTTP, normoactive bowel sounds, no masses or organomegaly appreciable Neuro: grossly nonfocal Psych: affect somewhat strange but shows full range, well groomed, speech mildly slowed but normal volume, normal eye contact   ASSESSMENT/PLAN:  Irritable bowel syndrome Suspect overall continued intermittent diarrhea related to IBS. Generally improving after flare (vs. gastroenteritis) a few weeks ago. Since she endorses increased fatigue since this GI upset event, will check CBC and CMET today. F/u if not improving.  Elevated BP BP normal today.  Continue to monitor at future visits.  Hypothyroid Pt prefers to stay on dose of synthroid as she strongly believes her diarrhea and nausea was due to taking for a few days. Will continue daily dose for now and plan to recheck TSH in 2 months.  Hyperlipidemia Discussed importance of low fat diet and working on exercise to lower cholesterol levels.  Depression Anxiety seems well controlled at present. She is on xanax for both menieres and anxiety, also takes celexa. Requested refill of xanax today. Appropriate interval since last prescription. Denies having plans to harm herself or others. Refilled x 2 months.   FOLLOW UP: F/u in 2 months for anxiety/dep and recheck TSH.  [place] J. Pollie Meyer, MD [facility]

## 2013-10-28 ENCOUNTER — Encounter: Payer: Self-pay | Admitting: Family Medicine

## 2013-11-13 ENCOUNTER — Other Ambulatory Visit: Payer: Self-pay | Admitting: *Deleted

## 2013-11-29 ENCOUNTER — Encounter (HOSPITAL_BASED_OUTPATIENT_CLINIC_OR_DEPARTMENT_OTHER): Payer: Self-pay | Admitting: *Deleted

## 2013-11-29 NOTE — Progress Notes (Signed)
No labs needed unless dr want istat-takes diuretic for meniers-had cmet-10/25/13

## 2013-12-03 ENCOUNTER — Ambulatory Visit: Payer: Self-pay | Admitting: Physician Assistant

## 2013-12-03 NOTE — H&P (Signed)
Karis JubaJoan L Noren is an 58 y.o. female.   Chief Complaint: right wrist pain and mass HPI: The patient is a 58 year old female.  She has a palpable knot on the right wrist increasing in size over the last year.  Referred here by her primary care physician, Dr. Randal BubaErin Honig and was told that it was likely a ganglion cyst but the pain is getting somewhat unbearable. Also carpal tunnel syndrome confirmed with recent NCV.  she does describe some pain that radiating down into the fingers and the palmar aspect of the hand.  She is right-hand dominant.    Past Medical History  Diagnosis Date  . Irritable bowel syndrome   . GERD   . Asthma   . Arthritis   . Depression     overlap with GAD  . Hyperlipidemia   . Migraines   . Hypothyroid   . Genital warts   . Allergic rhinitis   . Meniere's disease   . PTSD (post-traumatic stress disorder)   . Insomnia   . OSA (obstructive sleep apnea)     uses a cpap  . PONV (postoperative nausea and vomiting)   . HOH (hard of hearing)   . Anxiety     Past Surgical History  Procedure Laterality Date  . Cholecystectomy  1981  . Appendectomy  1981  . Tonsillectomy  1976  . Polyp removed from colon  1964 & 2010  . Bunionectomy    . Inner ear surgery    . Abdominal hysterectomy    . Tubal ligation      Family History  Problem Relation Age of Onset  . Arthritis    . Hyperlipidemia Father   . Coronary artery disease    . Hypertension    . Leukemia Mother   . Hyperlipidemia Mother    Social History:  reports that she quit smoking about 28 years ago. She has never used smokeless tobacco. She reports that she does not drink alcohol or use illicit drugs.  Allergies:  Allergies  Allergen Reactions  . Ciprofloxacin Hcl Anaphylaxis  . Phenergan [Promethazine Hcl] Anaphylaxis    Throat closing in  . Codeine     REACTION: ABD Pain , Hallucinations  . Erythromycin     REACTION: SOB  . Penicillins     REACTION: hives  . Promethazine Hcl     REACTION:  thorat swelling  . Sulfonamide Derivatives     REACTION: SOB  . Ultram [Tramadol] Other (See Comments)    Severe headaches     (Not in a hospital admission)  No results found for this or any previous visit (from the past 48 hour(s)). No results found.  Review of Systems  Constitutional: Negative.   HENT: Positive for hearing loss and tinnitus. Negative for congestion, ear discharge, ear pain, nosebleeds and sore throat.   Eyes: Negative.   Respiratory: Negative.  Negative for stridor.   Cardiovascular: Negative.   Gastrointestinal: Negative.   Genitourinary: Negative.   Musculoskeletal: Positive for joint pain.  Skin: Negative.   Neurological: Positive for tingling, focal weakness and headaches. Negative for dizziness, tremors, sensory change, speech change, seizures and loss of consciousness.  Endo/Heme/Allergies: Bruises/bleeds easily.  Psychiatric/Behavioral: Positive for depression. The patient is nervous/anxious.     There were no vitals taken for this visit. Physical Exam  Constitutional: She is oriented to person, place, and time. She appears well-developed and well-nourished. No distress.  HENT:  Head: Normocephalic and atraumatic.  Nose: Nose normal.  Eyes: Conjunctivae and  EOM are normal. Pupils are equal, round, and reactive to light.  Neck: Normal range of motion. Neck supple.  Cardiovascular: Normal rate and intact distal pulses.   Respiratory: Effort normal. No respiratory distress. She has no wheezes.  GI: Soft. She exhibits no distension. There is no tenderness.  Musculoskeletal:  She does have palpable and visible ganglion cyst over the palmar/radial aspect of the wrist that is tender to palpation.  It is a firm nodule.  She has full motion with the wrist.  She also has a positive Tinel's.  Slightly weakened grip strength on the right which is her dominant hand compared to the contralateral left.  Positive Phalen's.    Neurological: She is alert and oriented  to person, place, and time.  Skin: Skin is warm and dry. No rash noted. No erythema.  Psychiatric: She has a normal mood and affect. Her behavior is normal.     Assessment/Plan Right wrist ganglion cyst and carpal tunnel syndrome  We discussed risks and benefits of right carpal tunnel release and ganglion cyst excision and patient wishes to proceed.  This will be performed outpatient at [facility].  General ansthesia and tourniquet.    Margart SicklesChadwell, Lindsee Labarre 12/03/2013, 12:48 PM

## 2013-12-04 ENCOUNTER — Encounter (HOSPITAL_BASED_OUTPATIENT_CLINIC_OR_DEPARTMENT_OTHER): Payer: Medicare HMO | Admitting: Anesthesiology

## 2013-12-04 ENCOUNTER — Ambulatory Visit (HOSPITAL_BASED_OUTPATIENT_CLINIC_OR_DEPARTMENT_OTHER)
Admission: RE | Admit: 2013-12-04 | Discharge: 2013-12-04 | Disposition: A | Discharge status: Home / Self Care | Payer: Medicare HMO | Source: Ambulatory Visit | Attending: Orthopedic Surgery | Admitting: Orthopedic Surgery

## 2013-12-04 ENCOUNTER — Encounter (HOSPITAL_BASED_OUTPATIENT_CLINIC_OR_DEPARTMENT_OTHER)
Admission: RE | Disposition: A | Discharge status: Home / Self Care | Payer: Self-pay | Source: Ambulatory Visit | Attending: Orthopedic Surgery

## 2013-12-04 ENCOUNTER — Encounter (HOSPITAL_BASED_OUTPATIENT_CLINIC_OR_DEPARTMENT_OTHER): Payer: Self-pay | Admitting: *Deleted

## 2013-12-04 ENCOUNTER — Telehealth: Payer: Self-pay | Admitting: Family Medicine

## 2013-12-04 ENCOUNTER — Ambulatory Visit (HOSPITAL_BASED_OUTPATIENT_CLINIC_OR_DEPARTMENT_OTHER): Payer: Medicare HMO | Admitting: Anesthesiology

## 2013-12-04 DIAGNOSIS — G5601 Carpal tunnel syndrome, right upper limb: Secondary | ICD-10-CM | POA: Insufficient documentation

## 2013-12-04 DIAGNOSIS — M1990 Unspecified osteoarthritis, unspecified site: Secondary | ICD-10-CM | POA: Insufficient documentation

## 2013-12-04 DIAGNOSIS — F329 Major depressive disorder, single episode, unspecified: Secondary | ICD-10-CM | POA: Insufficient documentation

## 2013-12-04 DIAGNOSIS — G4733 Obstructive sleep apnea (adult) (pediatric): Secondary | ICD-10-CM | POA: Diagnosis not present

## 2013-12-04 DIAGNOSIS — K219 Gastro-esophageal reflux disease without esophagitis: Secondary | ICD-10-CM | POA: Diagnosis not present

## 2013-12-04 DIAGNOSIS — Z87891 Personal history of nicotine dependence: Secondary | ICD-10-CM | POA: Insufficient documentation

## 2013-12-04 DIAGNOSIS — M67431 Ganglion, right wrist: Secondary | ICD-10-CM | POA: Insufficient documentation

## 2013-12-04 DIAGNOSIS — E039 Hypothyroidism, unspecified: Secondary | ICD-10-CM | POA: Insufficient documentation

## 2013-12-04 DIAGNOSIS — Z885 Allergy status to narcotic agent status: Secondary | ICD-10-CM | POA: Insufficient documentation

## 2013-12-04 DIAGNOSIS — E785 Hyperlipidemia, unspecified: Secondary | ICD-10-CM | POA: Insufficient documentation

## 2013-12-04 DIAGNOSIS — Z880 Allergy status to penicillin: Secondary | ICD-10-CM | POA: Insufficient documentation

## 2013-12-04 DIAGNOSIS — Z888 Allergy status to other drugs, medicaments and biological substances status: Secondary | ICD-10-CM | POA: Insufficient documentation

## 2013-12-04 DIAGNOSIS — F419 Anxiety disorder, unspecified: Secondary | ICD-10-CM | POA: Insufficient documentation

## 2013-12-04 DIAGNOSIS — G4700 Insomnia, unspecified: Secondary | ICD-10-CM | POA: Diagnosis not present

## 2013-12-04 DIAGNOSIS — J45909 Unspecified asthma, uncomplicated: Secondary | ICD-10-CM | POA: Insufficient documentation

## 2013-12-04 DIAGNOSIS — H8109 Meniere's disease, unspecified ear: Secondary | ICD-10-CM | POA: Diagnosis not present

## 2013-12-04 DIAGNOSIS — F4310 Post-traumatic stress disorder, unspecified: Secondary | ICD-10-CM | POA: Insufficient documentation

## 2013-12-04 DIAGNOSIS — Z881 Allergy status to other antibiotic agents status: Secondary | ICD-10-CM | POA: Insufficient documentation

## 2013-12-04 DIAGNOSIS — K589 Irritable bowel syndrome without diarrhea: Secondary | ICD-10-CM | POA: Diagnosis not present

## 2013-12-04 DIAGNOSIS — Z882 Allergy status to sulfonamides status: Secondary | ICD-10-CM | POA: Diagnosis not present

## 2013-12-04 DIAGNOSIS — Z886 Allergy status to analgesic agent status: Secondary | ICD-10-CM | POA: Diagnosis not present

## 2013-12-04 HISTORY — DX: Unspecified hearing loss, unspecified ear: H91.90

## 2013-12-04 HISTORY — DX: Nausea with vomiting, unspecified: R11.2

## 2013-12-04 HISTORY — PX: CARPAL TUNNEL RELEASE: SHX101

## 2013-12-04 HISTORY — DX: Other specified postprocedural states: Z98.890

## 2013-12-04 HISTORY — DX: Anxiety disorder, unspecified: F41.9

## 2013-12-04 LAB — POCT I-STAT, CHEM 8
BUN: 12 mg/dL (ref 6–23)
CHLORIDE: 90 meq/L — AB (ref 96–112)
Calcium, Ion: 1.18 mmol/L (ref 1.12–1.23)
Creatinine, Ser: 0.9 mg/dL (ref 0.50–1.10)
GLUCOSE: 105 mg/dL — AB (ref 70–99)
HCT: 43 % (ref 36.0–46.0)
HEMOGLOBIN: 14.6 g/dL (ref 12.0–15.0)
POTASSIUM: 2.9 meq/L — AB (ref 3.7–5.3)
SODIUM: 131 meq/L — AB (ref 137–147)
TCO2: 28 mmol/L (ref 0–100)

## 2013-12-04 LAB — POCT HEMOGLOBIN-HEMACUE: Hemoglobin: 13.9 g/dL (ref 12.0–15.0)

## 2013-12-04 SURGERY — CARPAL TUNNEL RELEASE
Anesthesia: General | Site: Wrist | Laterality: Right

## 2013-12-04 MED ORDER — SCOPOLAMINE 1 MG/3DAYS TD PT72
MEDICATED_PATCH | TRANSDERMAL | Status: DC | PRN
  Administered 2013-12-04: 1 via TRANSDERMAL

## 2013-12-04 MED ORDER — HYDROCODONE-ACETAMINOPHEN 5-325 MG PO TABS
ORAL_TABLET | ORAL | Status: AC
  Filled 2013-12-04: qty 1

## 2013-12-04 MED ORDER — MIDAZOLAM HCL 2 MG/2ML IJ SOLN
INTRAMUSCULAR | Status: AC
  Filled 2013-12-04: qty 2

## 2013-12-04 MED ORDER — SCOPOLAMINE 1 MG/3DAYS TD PT72
MEDICATED_PATCH | TRANSDERMAL | Status: AC
  Filled 2013-12-04: qty 1

## 2013-12-04 MED ORDER — ONDANSETRON HCL 4 MG/2ML IJ SOLN
4.0000 mg | Freq: Once | INTRAMUSCULAR | Status: AC
  Administered 2013-12-04: 4 mg via INTRAVENOUS

## 2013-12-04 MED ORDER — LIDOCAINE HCL (CARDIAC) 20 MG/ML IV SOLN: INTRAVENOUS | Status: DC | PRN

## 2013-12-04 MED ORDER — CHLORHEXIDINE GLUCONATE 4 % EX LIQD: 60.0000 mL | Freq: Once | CUTANEOUS | Status: DC

## 2013-12-04 MED ORDER — LIDOCAINE HCL (CARDIAC) 20 MG/ML IV SOLN
INTRAVENOUS | Status: DC | PRN
  Administered 2013-12-04: 60 mg via INTRAVENOUS

## 2013-12-04 MED ORDER — FENTANYL CITRATE 0.05 MG/ML IJ SOLN
25.0000 ug | INTRAMUSCULAR | Status: DC | PRN
  Administered 2013-12-04: 25 ug via INTRAVENOUS
  Administered 2013-12-04: 50 ug via INTRAVENOUS

## 2013-12-04 MED ORDER — CLINDAMYCIN PHOSPHATE 900 MG/50ML IV SOLN
INTRAVENOUS | Status: AC
  Filled 2013-12-04: qty 50

## 2013-12-04 MED ORDER — KETOROLAC TROMETHAMINE 30 MG/ML IJ SOLN
30.0000 mg | Freq: Once | INTRAMUSCULAR | Status: AC | PRN
  Administered 2013-12-04: 30 mg via INTRAVENOUS

## 2013-12-04 MED ORDER — SODIUM CHLORIDE 0.9 % IV SOLN: INTRAVENOUS | Status: DC

## 2013-12-04 MED ORDER — PROPOFOL 10 MG/ML IV BOLUS
INTRAVENOUS | Status: DC | PRN
  Administered 2013-12-04: 200 mg via INTRAVENOUS
  Administered 2013-12-04 (×2): 20 mg via INTRAVENOUS

## 2013-12-04 MED ORDER — MIDAZOLAM HCL 5 MG/5ML IJ SOLN
INTRAMUSCULAR | Status: DC | PRN
  Administered 2013-12-04: 2 mg via INTRAVENOUS

## 2013-12-04 MED ORDER — DIPHENHYDRAMINE HCL 50 MG/ML IJ SOLN
INTRAMUSCULAR | Status: DC | PRN
  Administered 2013-12-04: 12.5 mg via INTRAVENOUS

## 2013-12-04 MED ORDER — PROPOFOL 10 MG/ML IV EMUL
INTRAVENOUS | Status: AC
  Filled 2013-12-04: qty 50

## 2013-12-04 MED ORDER — BUPIVACAINE HCL (PF) 0.5 % IJ SOLN
INTRAMUSCULAR | Status: AC
  Filled 2013-12-04: qty 30

## 2013-12-04 MED ORDER — BUPIVACAINE HCL (PF) 0.5 % IJ SOLN
INTRAMUSCULAR | Status: DC | PRN
  Administered 2013-12-04: 10 mL

## 2013-12-04 MED ORDER — FENTANYL CITRATE 0.05 MG/ML IJ SOLN: 50.0000 ug | INTRAMUSCULAR | Status: DC | PRN

## 2013-12-04 MED ORDER — MIDAZOLAM HCL 2 MG/2ML IJ SOLN: 1.0000 mg | INTRAMUSCULAR | Status: DC | PRN

## 2013-12-04 MED ORDER — ONDANSETRON HCL 4 MG/2ML IJ SOLN
INTRAMUSCULAR | Status: DC | PRN
  Administered 2013-12-04: 4 mg via INTRAVENOUS

## 2013-12-04 MED ORDER — HYDROCODONE-ACETAMINOPHEN 5-325 MG PO TABS: 1.0000 | ORAL_TABLET | ORAL | Status: DC | PRN

## 2013-12-04 MED ORDER — CLINDAMYCIN PHOSPHATE 900 MG/50ML IV SOLN
900.0000 mg | INTRAVENOUS | Status: AC
  Administered 2013-12-04: 900 mg via INTRAVENOUS

## 2013-12-04 MED ORDER — LACTATED RINGERS IV SOLN
INTRAVENOUS | Status: DC
  Administered 2013-12-04 (×2): via INTRAVENOUS

## 2013-12-04 MED ORDER — HYDROCODONE-ACETAMINOPHEN 5-325 MG PO TABS
1.0000 | ORAL_TABLET | Freq: Once | ORAL | Status: AC
  Administered 2013-12-04: 1 via ORAL

## 2013-12-04 MED ORDER — ONDANSETRON HCL 4 MG/2ML IJ SOLN
INTRAMUSCULAR | Status: AC
  Filled 2013-12-04: qty 2

## 2013-12-04 MED ORDER — FENTANYL CITRATE 0.05 MG/ML IJ SOLN
INTRAMUSCULAR | Status: AC
  Filled 2013-12-04: qty 4

## 2013-12-04 MED ORDER — FENTANYL CITRATE 0.05 MG/ML IJ SOLN
INTRAMUSCULAR | Status: DC | PRN
  Administered 2013-12-04: 100 ug via INTRAVENOUS

## 2013-12-04 MED ORDER — ONDANSETRON HCL 4 MG PO TABS: 4.0000 mg | ORAL_TABLET | Freq: Four times a day (QID) | ORAL | Status: DC | PRN

## 2013-12-04 MED ORDER — FENTANYL CITRATE 0.05 MG/ML IJ SOLN
INTRAMUSCULAR | Status: AC
  Filled 2013-12-04: qty 2

## 2013-12-04 MED ORDER — METOCLOPRAMIDE HCL 5 MG/ML IJ SOLN: 10.0000 mg | Freq: Once | INTRAMUSCULAR | Status: DC | PRN

## 2013-12-04 MED ORDER — KETOROLAC TROMETHAMINE 30 MG/ML IJ SOLN
INTRAMUSCULAR | Status: AC
  Filled 2013-12-04: qty 1

## 2013-12-04 SURGICAL SUPPLY — 41 items
BANDAGE ELASTIC 3 VELCRO ST LF (GAUZE/BANDAGES/DRESSINGS) ×2 IMPLANT
BLADE MINI RND TIP GREEN BEAV (BLADE) IMPLANT
BLADE SURG 15 STRL LF DISP TIS (BLADE) ×1 IMPLANT
BLADE SURG 15 STRL SS (BLADE) ×1
BNDG CONFORM 3 STRL LF (GAUZE/BANDAGES/DRESSINGS) ×2 IMPLANT
BNDG ESMARK 4X9 LF (GAUZE/BANDAGES/DRESSINGS) ×2 IMPLANT
CORDS BIPOLAR (ELECTRODE) ×2 IMPLANT
COVER BACK TABLE 60X90IN (DRAPES) ×2 IMPLANT
COVER MAYO STAND STRL (DRAPES) ×2 IMPLANT
DRAPE EXTREMITY TIBURON (DRAPES) ×2 IMPLANT
DRSG EMULSION OIL 3X3 NADH (GAUZE/BANDAGES/DRESSINGS) ×2 IMPLANT
DRSG PAD ABDOMINAL 8X10 ST (GAUZE/BANDAGES/DRESSINGS) ×2 IMPLANT
DURAPREP 26ML APPLICATOR (WOUND CARE) ×2 IMPLANT
GAUZE SPONGE 4X4 12PLY STRL (GAUZE/BANDAGES/DRESSINGS) ×2 IMPLANT
GLOVE BIO SURGEON STRL SZ 6.5 (GLOVE) ×4 IMPLANT
GLOVE BIOGEL PI IND STRL 7.0 (GLOVE) ×1 IMPLANT
GLOVE BIOGEL PI IND STRL 8 (GLOVE) ×2 IMPLANT
GLOVE BIOGEL PI INDICATOR 7.0 (GLOVE) ×1
GLOVE BIOGEL PI INDICATOR 8 (GLOVE) ×2
GLOVE SURG ORTHO 8.0 STRL STRW (GLOVE) ×2 IMPLANT
GOWN STRL REUS W/ TWL LRG LVL3 (GOWN DISPOSABLE) ×2 IMPLANT
GOWN STRL REUS W/ TWL XL LVL3 (GOWN DISPOSABLE) ×1 IMPLANT
GOWN STRL REUS W/TWL LRG LVL3 (GOWN DISPOSABLE) ×2
GOWN STRL REUS W/TWL XL LVL3 (GOWN DISPOSABLE) ×1
NEEDLE HYPO 25X1 1.5 SAFETY (NEEDLE) ×2 IMPLANT
NS IRRIG 1000ML POUR BTL (IV SOLUTION) ×2 IMPLANT
PACK BASIN DAY SURGERY FS (CUSTOM PROCEDURE TRAY) ×2 IMPLANT
PADDING CAST ABS 3INX4YD NS (CAST SUPPLIES) ×1
PADDING CAST ABS 4INX4YD NS (CAST SUPPLIES) ×1
PADDING CAST ABS COTTON 3X4 (CAST SUPPLIES) ×1 IMPLANT
PADDING CAST ABS COTTON 4X4 ST (CAST SUPPLIES) ×1 IMPLANT
SPLINT PLASTER CAST XFAST 3X15 (CAST SUPPLIES) ×1 IMPLANT
SPLINT PLASTER XTRA FASTSET 3X (CAST SUPPLIES) ×1
STOCKINETTE 4X48 STRL (DRAPES) ×2 IMPLANT
SUT ETHILON 4 0 PS 2 18 (SUTURE) ×2 IMPLANT
SUT ETHILON 5 0 P 3 18 (SUTURE)
SUT NYLON ETHILON 5-0 P-3 1X18 (SUTURE) IMPLANT
SYR BULB 3OZ (MISCELLANEOUS) ×2 IMPLANT
SYR CONTROL 10ML LL (SYRINGE) ×2 IMPLANT
TOWEL OR 17X24 6PK STRL BLUE (TOWEL DISPOSABLE) ×2 IMPLANT
UNDERPAD 30X30 INCONTINENT (UNDERPADS AND DIAPERS) ×2 IMPLANT

## 2013-12-04 NOTE — Anesthesia Preprocedure Evaluation (Signed)
Anesthesia Evaluation  Patient identified by MRN, date of birth, ID band Patient awake    Reviewed: Allergy & Precautions, H&P , NPO status , Patient's Chart, lab work & pertinent test results  History of Anesthesia Complications (+) PONV  Airway Mallampati: II  TM Distance: >3 FB Neck ROM: Full    Dental  (+) Teeth Intact, Dental Advisory Given   Pulmonary former smoker,  breath sounds clear to auscultation        Cardiovascular Rhythm:Regular Rate:Normal     Neuro/Psych    GI/Hepatic   Endo/Other    Renal/GU      Musculoskeletal   Abdominal   Peds  Hematology   Anesthesia Other Findings   Reproductive/Obstetrics                             Anesthesia Physical Anesthesia Plan  ASA: II  Anesthesia Plan: General   Post-op Pain Management:    Induction: Intravenous  Airway Management Planned: LMA  Additional Equipment:   Intra-op Plan:   Post-operative Plan:   Informed Consent: I have reviewed the patients History and Physical, chart, labs and discussed the procedure including the risks, benefits and alternatives for the proposed anesthesia with the patient or authorized representative who has indicated his/her understanding and acceptance.   Dental advisory given  Plan Discussed with: CRNA and Anesthesiologist  Anesthesia Plan Comments: (H/O Post-op Nausea and vomiting Meniere's disease IBS Sleep apnea on CPAP  Plan GA with LMA  Jenna Broodavid Joelyn Lover, MD)        Anesthesia Quick Evaluation

## 2013-12-04 NOTE — Brief Op Note (Signed)
12/04/2013  11:19 AM  PATIENT:  Jenna Garner  58 y.o. female  PRE-OPERATIVE DIAGNOSIS:  Carpal tunnel syndrome, right upper limb, ganglion right wrist  POST-OPERATIVE DIAGNOSIS:  carpal tunnel syndrome ganglion right wrist  PROCEDURE:  Procedure(s): RIGHT WRIST CARPAL TUNNEL RELEASE/EXCISION GANGLION WRIST PRIMARY (Right)  SURGEON:  Surgeon(s) and Role:    * W D Carloyn Manneraffrey Jr., MD - Primary  PHYSICIAN ASSISTANT: Margart SicklesJoshua Marquel Spoto, PA-C  ASSISTANTS:   ANESTHESIA:   local and general  EBL:  Total I/O In: 1000 [I.V.:1000] Out: -   BLOOD ADMINISTERED:none  DRAINS: none   LOCAL MEDICATIONS USED:  MARCAINE     SPECIMEN:  No Specimen  DISPOSITION OF SPECIMEN:  N/A  COUNTS:  YES  TOURNIQUET:   Total Tourniquet Time Documented: Upper Arm (Right) - 35 minutes Total: Upper Arm (Right) - 35 minutes   DICTATION: .Other Dictation: Dictation Number unknown  PLAN OF CARE: Discharge to home after PACU  PATIENT DISPOSITION:  PACU - hemodynamically stable.   Delay start of Pharmacological VTE agent (>24hrs) due to surgical blood loss or risk of bleeding: not applicable

## 2013-12-04 NOTE — H&P (View-Only) (Signed)
Jenna JubaJoan L Garner is an 58 y.o. female.   Chief Complaint: right wrist pain and mass HPI: The patient is a 58 year old female.  She has a palpable knot on the right wrist increasing in size over the last year.  Referred here by her primary care physician, Dr. Randal BubaErin Honig and was told that it was likely a ganglion cyst but the pain is getting somewhat unbearable. Also carpal tunnel syndrome confirmed with recent NCV.  she does describe some pain that radiating down into the fingers and the palmar aspect of the hand.  She is right-hand dominant.    Past Medical History  Diagnosis Date  . Irritable bowel syndrome   . GERD   . Asthma   . Arthritis   . Depression     overlap with GAD  . Hyperlipidemia   . Migraines   . Hypothyroid   . Genital warts   . Allergic rhinitis   . Meniere's disease   . PTSD (post-traumatic stress disorder)   . Insomnia   . OSA (obstructive sleep apnea)     uses a cpap  . PONV (postoperative nausea and vomiting)   . HOH (hard of hearing)   . Anxiety     Past Surgical History  Procedure Laterality Date  . Cholecystectomy  1981  . Appendectomy  1981  . Tonsillectomy  1976  . Polyp removed from colon  1964 & 2010  . Bunionectomy    . Inner ear surgery    . Abdominal hysterectomy    . Tubal ligation      Family History  Problem Relation Age of Onset  . Arthritis    . Hyperlipidemia Father   . Coronary artery disease    . Hypertension    . Leukemia Mother   . Hyperlipidemia Mother    Social History:  reports that she quit smoking about 28 years ago. She has never used smokeless tobacco. She reports that she does not drink alcohol or use illicit drugs.  Allergies:  Allergies  Allergen Reactions  . Ciprofloxacin Hcl Anaphylaxis  . Phenergan [Promethazine Hcl] Anaphylaxis    Throat closing in  . Codeine     REACTION: ABD Pain , Hallucinations  . Erythromycin     REACTION: SOB  . Penicillins     REACTION: hives  . Promethazine Hcl     REACTION:  thorat swelling  . Sulfonamide Derivatives     REACTION: SOB  . Ultram [Tramadol] Other (See Comments)    Severe headaches     (Not in a hospital admission)  No results found for this or any previous visit (from the past 48 hour(s)). No results found.  Review of Systems  Constitutional: Negative.   HENT: Positive for hearing loss and tinnitus. Negative for congestion, ear discharge, ear pain, nosebleeds and sore throat.   Eyes: Negative.   Respiratory: Negative.  Negative for stridor.   Cardiovascular: Negative.   Gastrointestinal: Negative.   Genitourinary: Negative.   Musculoskeletal: Positive for joint pain.  Skin: Negative.   Neurological: Positive for tingling, focal weakness and headaches. Negative for dizziness, tremors, sensory change, speech change, seizures and loss of consciousness.  Endo/Heme/Allergies: Bruises/bleeds easily.  Psychiatric/Behavioral: Positive for depression. The patient is nervous/anxious.     There were no vitals taken for this visit. Physical Exam  Constitutional: She is oriented to person, place, and time. She appears well-developed and well-nourished. No distress.  HENT:  Head: Normocephalic and atraumatic.  Nose: Nose normal.  Eyes: Conjunctivae and  EOM are normal. Pupils are equal, round, and reactive to light.  Neck: Normal range of motion. Neck supple.  Cardiovascular: Normal rate and intact distal pulses.   Respiratory: Effort normal. No respiratory distress. She has no wheezes.  GI: Soft. She exhibits no distension. There is no tenderness.  Musculoskeletal:  She does have palpable and visible ganglion cyst over the palmar/radial aspect of the wrist that is tender to palpation.  It is a firm nodule.  She has full motion with the wrist.  She also has a positive Tinel's.  Slightly weakened grip strength on the right which is her dominant hand compared to the contralateral left.  Positive Phalen's.    Neurological: She is alert and oriented  to person, place, and time.  Skin: Skin is warm and dry. No rash noted. No erythema.  Psychiatric: She has a normal mood and affect. Her behavior is normal.     Assessment/Plan Right wrist ganglion cyst and carpal tunnel syndrome  We discussed risks and benefits of right carpal tunnel release and ganglion cyst excision and patient wishes to proceed.  This [facility] performed [facility].  General ansthesia and tourniquet.    Elijan Googe 12/03/2013, 12:48 PM    

## 2013-12-04 NOTE — Transfer of Care (Signed)
Immediate Anesthesia Transfer of Care Note  Patient: Jenna Garner  Procedure(s) Performed: Procedure(s): RIGHT WRIST CARPAL TUNNEL RELEASE/EXCISION GANGLION WRIST PRIMARY (Right)  Patient Location: PACU  Anesthesia Type:General  Level of Consciousness: awake, sedated and patient cooperative  Airway & Oxygen Therapy: Patient Spontanous Breathing and Patient connected to face mask oxygen  Post-op Assessment: Report given to PACU RN and Post -op Vital signs reviewed and stable  Post vital signs: Reviewed and stable  Complications: No apparent anesthesia complications

## 2013-12-04 NOTE — Anesthesia Postprocedure Evaluation (Signed)
  Anesthesia Post-op Note  Patient: Jenna Garner  Procedure(s) Performed: Procedure(s): RIGHT WRIST CARPAL TUNNEL RELEASE/EXCISION GANGLION WRIST PRIMARY (Right)  Patient Location: PACU  Anesthesia Type: General   Level of Consciousness: awake, alert  and oriented  Airway and Oxygen Therapy: Patient Spontanous Breathing  Post-op Pain: moderate  Post-op Assessment: Post-op Vital signs reviewed  Post-op Vital Signs: Reviewed  Last Vitals:  Filed Vitals:   12/04/13 1345  BP: 148/74  Pulse: 95  Temp: 37 C  Resp:     Complications: No apparent anesthesia complications

## 2013-12-04 NOTE — Telephone Encounter (Signed)
Patient called into 24 hourline to ask if she can take her xanax tonight, considering she had surgery today and is on hydrocodone for p-op pain. Patient had ganglion cyst removal with carpal tunnel release and took one  5-325 hydrocodone at 7 pm and would like to take her xanax (0.5mg ) to help her sleep. She states she has been unable to get sleep tonight without her xanax d/t her nerves. - Discussed with patient the likelihood of the effects of xanax being increased d/t to co-use of both medications. She may experience increased sedation etc. Advised pt to take half of her regular dose tonight and see if that is helpful.  - If she has further questions in the morning she can call the clinic and her PCP can guide her on use.  Felix PaciniKuneff, Haziel Molner Do PGY-3

## 2013-12-04 NOTE — Discharge Instructions (Signed)
Diet: As you were doing prior to hospitalization   Activity: Increase activity slowly as tolerated  No lifting or driving today   Shower: May shower but need to keep splint covered and dry.  Dressing: do not change dressing, leave in place clean and dry until follow up appointment  Weight Bearing: no lifting with operative arm.  To prevent constipation: you may use a stool softener such as -  Colace ( over the counter) 100 mg by mouth twice a day  Drink plenty of fluids ( prune juice may be helpful) and high fiber foods  Miralax ( over the counter) for constipation as needed.   Precautions: If you experience chest pain or shortness of breath - call 911 immediately For transfer to the hospital emergency department!!  If you develop a fever greater that 101 F, purulent drainage from wound, increased redness or drainage from wound, or calf pain -- Call the office   Follow- Up Appointment: Please call for an appointment to be seen in 2 weeks  [place] - [phone]    Post Anesthesia Home Care Instructions  Activity: Get plenty of rest for the remainder of the day. A responsible adult should stay with you for 24 hours following the procedure.  For the next 24 hours, DO NOT: -Drive a car -Advertising copywriterperate machinery -Drink alcoholic beverages -Take any medication unless instructed by your physician -Make any legal decisions or sign important papers.  Meals: Start with liquid foods such as gelatin or soup. Progress to regular foods as tolerated. Avoid greasy, spicy, heavy foods. If nausea and/or vomiting occur, drink only clear liquids until the nausea and/or vomiting subsides. Call your physician if vomiting continues.  Special Instructions/Symptoms: Your throat may feel dry or sore from the anesthesia or the breathing tube placed in your throat during surgery. If this causes discomfort, gargle with warm salt water. The discomfort should disappear within 24 hours.

## 2013-12-04 NOTE — Interval H&P Note (Signed)
History and Physical Interval Note:  12/04/2013 8:45 AM  Jenna Garner  has presented today for surgery, with the diagnosis of Carpal tunnel syndrome, right upper limb, ganglion right wrist  The various methods of treatment have been discussed with the patient and family. After consideration of risks, benefits and other options for treatment, the patient has consented to  Procedure(s): RIGHT WRIST CARPAL TUNNEL RELEASE/EXCISION GANGLION WRIST PRIMARY (Right) as a surgical intervention .  The patient's history has been reviewed, patient examined, no change in status, stable for surgery.  I have reviewed the patient's chart and labs.  Questions were answered to the patient's satisfaction.     Juvon Teater JR,W D

## 2013-12-04 NOTE — Anesthesia Procedure Notes (Signed)
Procedure Name: LMA Insertion Date/Time: 12/04/2013 10:28 AM Performed by: Gar GibbonKEETON, Meher Kucinski S Pre-anesthesia Checklist: Patient identified, Emergency Drugs available, Suction available and Patient being monitored Patient Re-evaluated:Patient Re-evaluated prior to inductionOxygen Delivery Method: Circle System Utilized Preoxygenation: Pre-oxygenation with 100% oxygen Intubation Type: IV induction Ventilation: Mask ventilation without difficulty LMA: LMA inserted LMA Size: 3.0 Number of attempts: 1 Airway Equipment and Method: bite block Placement Confirmation: positive ETCO2 Tube secured with: Tape Dental Injury: Teeth and Oropharynx as per pre-operative assessment

## 2013-12-05 ENCOUNTER — Encounter (HOSPITAL_BASED_OUTPATIENT_CLINIC_OR_DEPARTMENT_OTHER): Payer: Self-pay | Admitting: Orthopedic Surgery

## 2013-12-05 NOTE — Op Note (Signed)
NAMMarcheta Grammes:  Lenk, Gera                  ACCOUNT NO.:  [id number]  MEDICAL RECORD NO.:  [id number]  LOCATION:                                 FACILITY:  PHYSICIAN:  Dyke BrackettW. D. Khye Hochstetler, M.D.    DATE OF BIRTH:  Jul 21, 1955  DATE OF PROCEDURE:  12/04/2013 DATE OF DISCHARGE:  12/04/2013                              OPERATIVE REPORT   INDICATIONS:  A 58 year old with nerve conduction proven moderately severe carpal tunnel with painful wrist ganglion, thought to be amenable to outpatient surgery.  PREOPERATIVE DIAGNOSES: 1. Carpal tunnel syndrome. 2. Volar wrist ganglion all for the right wrist.  POSTOPERATIVE DIAGNOSES: 1. Carpal tunnel syndrome. 2. Volar wrist ganglion all for the right wrist.  OPERATION: 1. Excision of volar wrist ganglion. 2. Carpal tunnel release.  SURGEON:  Dyke BrackettW. D. Adain Geurin, M.D.  ASSISTANT:  Margart SicklesJoshua Chadwell, PA-C  TOURNIQUET TIME:  Approximately 35 minutes.  DESCRIPTION OF PROCEDURE:  Supine position, general anesthetic, exsanguination or inflation to 250.  We initially did the carpal tunnel __________ just ulnar at the thenar crease.  Dissection carried down to the palmar fascia and distally to the level of the superficial arch proximally to include the antebrachial fascia of the wrist. We dissected on the ulnar side of the ligament, identified the median nerve under moderate compression.  No other anomalies noted.  Wound was irrigated and closed with interrupted 4-0 nylon.  A volar wrist ganglion was approached through a small volar incision on the volar radial aspect of the wrist.  The ganglion was intermittently associated with the radial artery.  We carefully dissected it free of the artery, seen to be arising.  Once we dissected it from the radioulnar joint, we resected it, and left the capsular defect open to minimize chance of recurrence.  Wound was irrigated and closed with interrupted nylon as well.  Marcaine without epinephrine infiltrated into  skin. Incisions lightly compressive sterile dressing, volar plaster splint applied.  The tourniquet was released after application of the splint.     Dyke BrackettW. D. Derek Huneycutt, M.D.     WDC/MEDQ  D:  12/04/2013  T:  12/04/2013  Job:  [id number]

## 2013-12-11 ENCOUNTER — Telehealth: Payer: Self-pay | Admitting: *Deleted

## 2013-12-11 NOTE — Telephone Encounter (Signed)
Pt states that she has thrush, states that she gets this everytime she "is put to sleep".  Dr. Madelon Lipsaffrey just performed surgery on her.  Wants Dr. Pollie MeyerMcIntyre to call her in the "generic form of thrush treatment". Fleeger, Jenna Garner

## 2013-12-13 MED ORDER — NYSTATIN 100000 UNIT/ML MT SUSP: 5.0000 mL | Freq: Four times a day (QID) | OROMUCOSAL | Status: DC

## 2013-12-13 NOTE — Telephone Encounter (Signed)
Rx sent in. Thanks! Lemario Chaikin J Blake Goya, MD  

## 2013-12-13 NOTE — Telephone Encounter (Signed)
Pt informed that nystatin (MYCOSTATIN) 100000 UNIT/ML suspension was sent in to her pharmacy. Clovis PuMartin, Tamika L, RN

## 2013-12-13 NOTE — Telephone Encounter (Signed)
Jenna Garner is calling back to ask about rx for thrush she originally sent msg about on Wed.  Please contact pharmacy with a rx for nystatin.

## 2013-12-26 ENCOUNTER — Ambulatory Visit: Payer: Commercial Managed Care - HMO | Admitting: Family Medicine

## 2014-01-06 ENCOUNTER — Ambulatory Visit: Payer: Commercial Managed Care - HMO

## 2014-01-14 ENCOUNTER — Encounter: Payer: Self-pay | Admitting: Orthopedic Surgery

## 2014-01-17 ENCOUNTER — Other Ambulatory Visit: Payer: Self-pay | Admitting: Family Medicine

## 2014-01-23 ENCOUNTER — Ambulatory Visit: Payer: Commercial Managed Care - HMO | Admitting: Family Medicine

## 2014-01-29 ENCOUNTER — Other Ambulatory Visit: Payer: Self-pay | Admitting: *Deleted

## 2014-01-29 MED ORDER — ONDANSETRON HCL 4 MG PO TABS: 4.0000 mg | ORAL_TABLET | Freq: Three times a day (TID) | ORAL | Status: DC | PRN

## 2014-02-05 ENCOUNTER — Ambulatory Visit: Payer: Commercial Managed Care - HMO | Admitting: Family Medicine

## 2014-02-07 ENCOUNTER — Encounter: Payer: Self-pay | Admitting: Orthopedic Surgery

## 2014-02-18 ENCOUNTER — Ambulatory Visit: Payer: Commercial Managed Care - HMO

## 2014-02-20 ENCOUNTER — Ambulatory Visit: Payer: Commercial Managed Care - HMO

## 2014-02-20 ENCOUNTER — Other Ambulatory Visit: Payer: Self-pay | Admitting: *Deleted

## 2014-02-20 MED ORDER — CITALOPRAM HYDROBROMIDE 20 MG PO TABS: 20.0000 mg | ORAL_TABLET | Freq: Every day | ORAL | Status: DC

## 2014-02-20 MED ORDER — MONTELUKAST SODIUM 10 MG PO TABS
10.0000 mg | ORAL_TABLET | Freq: Every day | ORAL | Status: DC
Start: 2014-02-20 — End: 2014-04-12

## 2014-02-20 MED ORDER — LEVOTHYROXINE SODIUM 50 MCG PO TABS: 75.0000 ug | ORAL_TABLET | Freq: Every day | ORAL | Status: DC

## 2014-02-20 MED ORDER — MELOXICAM 15 MG PO TABS: 15.0000 mg | ORAL_TABLET | Freq: Every day | ORAL | Status: DC

## 2014-02-20 MED ORDER — GABAPENTIN 300 MG PO CAPS: 300.0000 mg | ORAL_CAPSULE | Freq: Three times a day (TID) | ORAL | Status: DC

## 2014-02-20 MED ORDER — TRIAMTERENE-HCTZ 37.5-25 MG PO CAPS: 1.0000 | ORAL_CAPSULE | Freq: Every day | ORAL | Status: DC

## 2014-02-27 ENCOUNTER — Ambulatory Visit: Payer: Commercial Managed Care - HMO

## 2014-02-27 ENCOUNTER — Telehealth: Payer: Self-pay | Admitting: *Deleted

## 2014-02-27 MED ORDER — GABAPENTIN 300 MG PO CAPS: 300.0000 mg | ORAL_CAPSULE | Freq: Three times a day (TID) | ORAL | Status: DC

## 2014-02-27 NOTE — Telephone Encounter (Signed)
[facility] in [facility]abapentin directions.  Fax placed in provider box for review and sign.  Clovis PuMartin, Kamie Korber L, RN

## 2014-02-27 NOTE — Telephone Encounter (Signed)
Filled out form to clarify dose (one cap three times daily). Will fax back, also will send in new rx to fix the instructions in our system.  Latrelle DodrillBrittany J Nomi Rudnicki, MD

## 2014-03-04 ENCOUNTER — Telehealth: Payer: Self-pay | Admitting: *Deleted

## 2014-03-04 NOTE — Telephone Encounter (Signed)
Left voice message informing pt that we can't get any egg free flu vaccine this Flu season.  Per Maryella Shiversobert B., CMA the egg free flu vaccine has a very short expiration date and the patients that have egg allergies should received the vaccine before the beginning of the year.  [facility] Flu vaccines are pre-ordered through the hospital during the summer.  Clovis PuMartin, Tamika L, RN

## 2014-03-06 ENCOUNTER — Ambulatory Visit: Payer: Commercial Managed Care - HMO

## 2014-03-20 ENCOUNTER — Ambulatory Visit: Payer: Commercial Managed Care - HMO | Admitting: Family Medicine

## 2014-04-01 ENCOUNTER — Ambulatory Visit: Payer: Commercial Managed Care - HMO | Admitting: Family Medicine

## 2014-04-09 ENCOUNTER — Encounter: Payer: Self-pay | Admitting: Family Medicine

## 2014-04-09 ENCOUNTER — Ambulatory Visit (INDEPENDENT_AMBULATORY_CARE_PROVIDER_SITE_OTHER): Payer: Commercial Managed Care - HMO | Admitting: Family Medicine

## 2014-04-09 VITALS — BP 144/83 | HR 73 | Temp 98.0°F | Ht 62.0 in | Wt 177.7 lb

## 2014-04-09 DIAGNOSIS — F329 Major depressive disorder, single episode, unspecified: Secondary | ICD-10-CM

## 2014-04-09 DIAGNOSIS — Z1239 Encounter for other screening for malignant neoplasm of breast: Secondary | ICD-10-CM

## 2014-04-09 DIAGNOSIS — A6000 Herpesviral infection of urogenital system, unspecified: Secondary | ICD-10-CM

## 2014-04-09 DIAGNOSIS — F32A Depression, unspecified: Secondary | ICD-10-CM

## 2014-04-09 DIAGNOSIS — E785 Hyperlipidemia, unspecified: Secondary | ICD-10-CM | POA: Diagnosis not present

## 2014-04-09 DIAGNOSIS — E871 Hypo-osmolality and hyponatremia: Secondary | ICD-10-CM | POA: Diagnosis not present

## 2014-04-09 DIAGNOSIS — IMO0001 Reserved for inherently not codable concepts without codable children: Secondary | ICD-10-CM

## 2014-04-09 DIAGNOSIS — E039 Hypothyroidism, unspecified: Secondary | ICD-10-CM | POA: Diagnosis not present

## 2014-04-09 DIAGNOSIS — R030 Elevated blood-pressure reading, without diagnosis of hypertension: Secondary | ICD-10-CM

## 2014-04-09 LAB — TSH: TSH: 2.457 u[IU]/mL (ref 0.350–4.500)

## 2014-04-09 MED ORDER — ALPRAZOLAM 0.5 MG PO TABS: ORAL_TABLET | ORAL | Status: AC

## 2014-04-09 NOTE — Patient Instructions (Signed)
I will send in refills on your medicines We are checking your blood work today. We will call you or send you a letter with the results.  Be well, Dr. Pollie MeyerMcIntyre

## 2014-04-09 NOTE — Progress Notes (Signed)
Patient ID: Jenna Garner, female   DOB: 01/31/1956, 59 y.o.   MRN: [id number]  HPI:  Genital herpes - takes valtrex 500 every other day. No flares in recent memory. Got worse after hysterectomy.  Wants to take a supplement (chromium something) for appetite suppressant. Advised I can't recommend for or against supplements as these are not regulated by the FDA.  SOB at night - noted on ROS. wakes up short of breath sometimes about once /week. No cough or fever. No swelling in legs. Has had sleep study and has sleep apnea. Does not use CPAP because she does not tolerate it. Has adjustable bed and sleeps with head propped up. Has never had echo. Has had this for years, staying the same, not getting worse. Does not get short of breath other times, no SOB with ambulation. Able to do everyday activities without SOB. Pt states she does not want to pursue further workup for this at this time.  Needs referral to have mammogram done in [place] Also states she needs one year order for all her medications for insurance purposes.  Anxiety - needs refill of xanax. Takes it about BID for her anxiety and meniere's disease. Feels well controlled. No SI/HI.  Hypothyroidism - currently taking 50mcg synthroid daily. Due for TSH recheck.  ROS: See HPI. Denies CP.  PMFSH: hx GERD, meniere's, IBS, asthma, depression, HLD, migraines, hypothyroidism, PTSD, OSA, chronic hyponatremia  PHYSICAL EXAM: BP 144/83 mmHg  Pulse 73  Temp(Src) 98 F (36.7 C) (Oral)  Ht 5\' 2"  (1.575 m)  Wt 177 lb 11.2 oz (80.604 kg)  BMI 32.49 kg/m2 Gen: NAD HEENT: NCAT Heart: RRR no murmurs Lungs: CTAB NWOB Neuro: grossly nonfocal speech normal Ext: No appreciable lower extremity edema bilaterally  Psych: normal range of affect, well groomed, speech slightly slowed (pt's baseline) but with normal volume, normal eye contact   ASSESSMENT/PLAN:  Health maintenance:  -order entered for mammogram since getting it at [facility]  Chronic  hyponatremia Likely related to HCTZ use for menieres disease. Recheck electrolytes & renal fxn today.   Hypothyroid Recheck TSH today, titrate as needed   Genital herpes Well controlled on QOD valtrex. Will order refill.   Hyperlipidemia Check direct LDL today with labs to assess control with diet/exercise.   Elevated BP BP still slightly above goal, at 144/82 on repeat. Pt on triamterene-HCTZ for her menieres disease. Advised she check her BP at home several x per week and return if BP consistently above 140/90. Suspect some degree of white coat HTN. If persistently elevated at home will need additional medication. F/u in 3 mos.   Depression Well controlled on celexa and xanax. Refill xanax today    FOLLOW UP: F/u in 3 months for chronic medical problems  GrenadaBrittany J. Pollie MeyerMcIntyre, MD [facility]

## 2014-04-10 LAB — COMPREHENSIVE METABOLIC PANEL
ALBUMIN: 4.5 g/dL (ref 3.5–5.2)
ALK PHOS: 72 U/L (ref 39–117)
ALT: 16 U/L (ref 0–35)
AST: 21 U/L (ref 0–37)
BUN: 16 mg/dL (ref 6–23)
CALCIUM: 9.6 mg/dL (ref 8.4–10.5)
CHLORIDE: 92 meq/L — AB (ref 96–112)
CO2: 26 mEq/L (ref 19–32)
Creat: 1.07 mg/dL (ref 0.50–1.10)
Glucose, Bld: 83 mg/dL (ref 70–99)
Potassium: 4.2 mEq/L (ref 3.5–5.3)
Sodium: 130 mEq/L — ABNORMAL LOW (ref 135–145)
Total Bilirubin: 0.9 mg/dL (ref 0.2–1.2)
Total Protein: 7 g/dL (ref 6.0–8.3)

## 2014-04-10 LAB — LDL CHOLESTEROL, DIRECT: Direct LDL: 144 mg/dL — ABNORMAL HIGH

## 2014-04-12 DIAGNOSIS — A6000 Herpesviral infection of urogenital system, unspecified: Secondary | ICD-10-CM | POA: Insufficient documentation

## 2014-04-12 MED ORDER — MONTELUKAST SODIUM 10 MG PO TABS: 10.0000 mg | ORAL_TABLET | Freq: Every day | ORAL | Status: AC

## 2014-04-12 MED ORDER — CITALOPRAM HYDROBROMIDE 20 MG PO TABS: 20.0000 mg | ORAL_TABLET | Freq: Every day | ORAL | Status: AC

## 2014-04-12 MED ORDER — VALACYCLOVIR HCL 500 MG PO TABS: 500.0000 mg | ORAL_TABLET | ORAL | Status: DC

## 2014-04-12 MED ORDER — ALBUTEROL SULFATE HFA 108 (90 BASE) MCG/ACT IN AERS: 2.0000 | INHALATION_SPRAY | Freq: Four times a day (QID) | RESPIRATORY_TRACT | Status: AC | PRN

## 2014-04-12 MED ORDER — CETIRIZINE HCL 10 MG PO TABS: 10.0000 mg | ORAL_TABLET | Freq: Every day | ORAL | Status: DC

## 2014-04-12 MED ORDER — FEXOFENADINE HCL 180 MG PO TABS: 180.0000 mg | ORAL_TABLET | Freq: Every day | ORAL | Status: AC

## 2014-04-12 MED ORDER — TRIAMTERENE-HCTZ 37.5-25 MG PO CAPS: 1.0000 | ORAL_CAPSULE | Freq: Every day | ORAL | Status: AC

## 2014-04-12 MED ORDER — ESOMEPRAZOLE MAGNESIUM 40 MG PO CPDR: 40.0000 mg | DELAYED_RELEASE_CAPSULE | Freq: Every day | ORAL | Status: DC

## 2014-04-12 MED ORDER — LEVOTHYROXINE SODIUM 50 MCG PO TABS: 50.0000 ug | ORAL_TABLET | Freq: Every day | ORAL | Status: DC

## 2014-04-12 NOTE — Assessment & Plan Note (Signed)
Check direct LDL today with labs to assess control with diet/exercise.

## 2014-04-12 NOTE — Assessment & Plan Note (Signed)
Well controlled on celexa and xanax. Refill xanax today

## 2014-04-12 NOTE — Assessment & Plan Note (Signed)
Recheck TSH today, titrate as needed

## 2014-04-12 NOTE — Assessment & Plan Note (Signed)
Likely related to HCTZ use for menieres disease. Recheck electrolytes & renal fxn today.

## 2014-04-12 NOTE — Assessment & Plan Note (Signed)
BP still slightly above goal, at 144/82 on repeat. Pt on triamterene-HCTZ for her menieres disease. Advised she check her BP at home several x per week and return if BP consistently above 140/90. Suspect some degree of white coat HTN. If persistently elevated at home will need additional medication. F/u in 3 mos.

## 2014-04-12 NOTE — Assessment & Plan Note (Signed)
Well controlled on QOD valtrex. Will order refill.

## 2014-04-25 ENCOUNTER — Telehealth: Payer: Self-pay | Admitting: Family Medicine

## 2014-04-25 NOTE — Telephone Encounter (Signed)
Pt is requesting results from 04/09/14

## 2014-04-25 NOTE — Telephone Encounter (Signed)
Patient informed of message from MD (see lab notes). Expressed understanding.

## 2014-06-17 ENCOUNTER — Telehealth: Payer: Self-pay | Admitting: Family Medicine

## 2014-06-17 NOTE — Telephone Encounter (Signed)
Labs mailed per patient request

## 2014-06-17 NOTE — Telephone Encounter (Signed)
Pt called and needs a copy of her lab results from 04/09/14. Can we mail this to her. Her address in Epic is correct . [email or web address]

## 2014-06-24 ENCOUNTER — Ambulatory Visit: Payer: Medicare HMO

## 2014-07-05 ENCOUNTER — Encounter: Payer: Self-pay | Admitting: Emergency Medicine

## 2014-07-05 ENCOUNTER — Emergency Department
Admission: EM | Admit: 2014-07-05 | Discharge: 2014-07-05 | Disposition: A | Discharge status: Home / Self Care | Payer: Medicare HMO | Attending: Emergency Medicine | Admitting: Emergency Medicine

## 2014-07-05 DIAGNOSIS — Z79899 Other long term (current) drug therapy: Secondary | ICD-10-CM | POA: Diagnosis not present

## 2014-07-05 DIAGNOSIS — R300 Dysuria: Secondary | ICD-10-CM | POA: Diagnosis not present

## 2014-07-05 DIAGNOSIS — Z791 Long term (current) use of non-steroidal anti-inflammatories (NSAID): Secondary | ICD-10-CM | POA: Diagnosis not present

## 2014-07-05 DIAGNOSIS — R110 Nausea: Secondary | ICD-10-CM | POA: Insufficient documentation

## 2014-07-05 DIAGNOSIS — M545 Low back pain: Secondary | ICD-10-CM | POA: Diagnosis not present

## 2014-07-05 DIAGNOSIS — Z87891 Personal history of nicotine dependence: Secondary | ICD-10-CM | POA: Insufficient documentation

## 2014-07-05 DIAGNOSIS — M4690 Unspecified inflammatory spondylopathy, site unspecified: Secondary | ICD-10-CM | POA: Diagnosis not present

## 2014-07-05 DIAGNOSIS — Z880 Allergy status to penicillin: Secondary | ICD-10-CM | POA: Insufficient documentation

## 2014-07-05 DIAGNOSIS — G8929 Other chronic pain: Secondary | ICD-10-CM | POA: Diagnosis not present

## 2014-07-05 HISTORY — DX: Diverticulosis of intestine, part unspecified, without perforation or abscess without bleeding: K57.90

## 2014-07-05 LAB — BASIC METABOLIC PANEL
ANION GAP: 10 (ref 5–15)
BUN: 13 mg/dL (ref 6–20)
CHLORIDE: 93 mmol/L — AB (ref 101–111)
CO2: 29 mmol/L (ref 22–32)
CREATININE: 1.14 mg/dL — AB (ref 0.44–1.00)
Calcium: 10 mg/dL (ref 8.9–10.3)
GFR calc Af Amer: >60 mL/min (ref >60)
GFR calc non Af Amer: 52 mL/min — ABNORMAL LOW (ref >60)
Glucose, Bld: 133 mg/dL — ABNORMAL HIGH (ref 65–99)
Potassium: 3.7 mmol/L (ref 3.5–5.1)
Sodium: 132 mmol/L — ABNORMAL LOW (ref 135–145)

## 2014-07-05 LAB — URINALYSIS COMPLETE WITH MICROSCOPIC (ARMC ONLY)
Bacteria, UA: NONE SEEN
Bilirubin Urine: NEGATIVE
GLUCOSE, UA: NEGATIVE mg/dL
Hgb urine dipstick: NEGATIVE
KETONES UR: NEGATIVE mg/dL
Leukocytes, UA: NEGATIVE
Nitrite: NEGATIVE
PROTEIN: NEGATIVE mg/dL
RBC / HPF: NONE SEEN RBC/hpf (ref 0–5)
Specific Gravity, Urine: 1.004 — ABNORMAL LOW (ref 1.005–1.030)
pH: 6 (ref 5.0–8.0)

## 2014-07-05 MED ORDER — ONDANSETRON 4 MG PO TBDP
ORAL_TABLET | ORAL | Status: AC
  Administered 2014-07-05: 4 mg via ORAL
  Filled 2014-07-05: qty 1

## 2014-07-05 MED ORDER — ONDANSETRON 4 MG PO TBDP
4.0000 mg | ORAL_TABLET | Freq: Once | ORAL | Status: AC
  Administered 2014-07-05: 4 mg via ORAL

## 2014-07-05 MED ORDER — CEPHALEXIN 500 MG PO CAPS: 500.0000 mg | ORAL_CAPSULE | Freq: Four times a day (QID) | ORAL | Status: DC

## 2014-07-05 MED ORDER — CEPHALEXIN 500 MG PO CAPS
500.0000 mg | ORAL_CAPSULE | ORAL | Status: AC
  Administered 2014-07-05: 500 mg via ORAL

## 2014-07-05 MED ORDER — CEPHALEXIN 500 MG PO CAPS
ORAL_CAPSULE | ORAL | Status: AC
  Administered 2014-07-05: 500 mg via ORAL
  Filled 2014-07-05: qty 1

## 2014-07-05 MED ORDER — ONDANSETRON 4 MG PO TBDP: 4.0000 mg | ORAL_TABLET | Freq: Four times a day (QID) | ORAL | Status: DC | PRN

## 2014-07-05 NOTE — ED Notes (Signed)
Pt presents to ER via EMS stating lower back pain. Pt also states dysuria.

## 2014-07-05 NOTE — Discharge Instructions (Signed)
Dysuria  Please return to the ER right away if your symptoms are worsening, he develop a fever, vomiting, or not tolerating indication well, have abdominal pain, weakness, numbness, incontinence or other new concerns arise.  Dysuria is the medical term for pain with urination. There are many causes for dysuria, but urinary tract infection is the most common. If a urinalysis was performed it can show that there is a urinary tract infection. A urine culture confirms that you or your child is sick. You will need to follow up with a healthcare provider because:  If a urine culture was done you will need to know the culture results and treatment recommendations.  If the urine culture was positive, you or your child will need to be put on antibiotics or know if the antibiotics prescribed are the right antibiotics for your urinary tract infection.  If the urine culture is negative (no urinary tract infection), then other causes may need to be explored or antibiotics need to be stopped. Today laboratory work may have been done and there does not seem to be an infection. If cultures were done they will take at least 24 to 48 hours to be completed. Today x-rays may have been taken and they read as normal. No cause can be found for the problems. The x-rays may be re-read by a radiologist and you will be contacted if additional findings are made. You or your child may have been put on medications to help with this problem until you can see your primary caregiver. If the problems get better, see your primary caregiver if the problems return. If you were given antibiotics (medications which kill germs), take all of the mediations as directed for the full course of treatment.  If laboratory work was done, you need to find the results. Leave a telephone number where you can be reached. If this is not possible, make sure you find out how you are to get test results. HOME CARE INSTRUCTIONS   Drink lots of fluids. For  adults, drink eight, 8 ounce glasses of clear juice or water a day. For children, replace fluids as suggested by your caregiver.  Empty the bladder often. Avoid holding urine for long periods of time.  After a bowel movement, women should cleanse front to back, using each tissue only once.  Empty your bladder before and after sexual intercourse.  Take all the medicine given to you until it is gone. You may feel better in a few days, but TAKE ALL MEDICINE.  Avoid caffeine, tea, alcohol and carbonated beverages, because they tend to irritate the bladder.  In men, alcohol may irritate the prostate.  Only take over-the-counter or prescription medicines for pain, discomfort, or fever as directed by your caregiver.  If your caregiver has given you a follow-up appointment, it is very important to keep that appointment. Not keeping the appointment could result in a chronic or permanent injury, pain, and disability. If there is any problem keeping the appointment, you must call back to this facility for assistance. SEEK IMMEDIATE MEDICAL CARE IF:   Back pain develops.  A fever develops.  There is nausea (feeling sick to your stomach) or vomiting (throwing up).  Problems are no better with medications or are getting worse. MAKE SURE YOU:   Understand these instructions.  Will watch your condition.  Will get help right away if you are not doing well or get worse. Document Released: 10/23/2003 Document Revised: 04/18/2011 Document Reviewed: 08/30/2007 Exit[employer] Patient Information 2015  [employer]. This information is not intended to replace advice given to you by your health care provider. Make sure you discuss any questions you have with your health care provider.

## 2014-07-05 NOTE — ED Provider Notes (Signed)
[facility] Emergency Department Provider Note  ____________________________________________  Time seen: Approximately 5:15 AM  I have reviewed the triage vital signs and the nursing notes.   HISTORY  Chief Complaint Back Pain and Dysuria    HPI Jenna Garner is a 59 y.o. female states she has been having approximately 24 hours of burning with urination. She also notes having a slight increase in her chronic back pain, but notes she has had several urinary tract infections all presenting with the same symptoms. Denies fever. Has had slight nausea today. Denies abdominal pain. No chest pain no shortness of breath.  Describes a burning with urination. No recent about X.   Past Medical History  Diagnosis Date  . Irritable bowel syndrome   . GERD   . Asthma   . Arthritis   . Depression     overlap with GAD  . Hyperlipidemia   . Migraines   . Hypothyroid   . Genital warts   . Allergic rhinitis   . Meniere's disease   . PTSD (post-traumatic stress disorder)   . Insomnia   . OSA (obstructive sleep apnea)     uses a cpap  . PONV (postoperative nausea and vomiting)   . HOH (hard of hearing)   . Anxiety   . Hypothyroid   . Diverticulosis     Patient Active Problem List   Diagnosis Date Noted  . Genital herpes 04/12/2014  . Chronic hyponatremia 10/03/2013  . Elevated BP 09/19/2013  . Ganglion cyst of wrist 05/30/2013  . Hyponatremia 08/24/2012  . Asthma   . Depression   . Hyperlipidemia   . Migraines   . Hypothyroid   . Genital warts   . Allergic rhinitis   . Meniere's disease   . PTSD (post-traumatic stress disorder)   . OSA (obstructive sleep apnea)   . GERD 01/14/2008  . Irritable bowel syndrome 01/14/2008    Past Surgical History  Procedure Laterality Date  . Cholecystectomy  1981  . Appendectomy  1981  . Tonsillectomy  1976  . Polyp removed from colon  1964 & 2010  . Bunionectomy    . Inner ear surgery    . Abdominal  hysterectomy    . Tubal ligation    . Carpal tunnel release Right 12/04/2013    Procedure: RIGHT WRIST CARPAL TUNNEL RELEASE/EXCISION GANGLION WRIST PRIMARY;  Surgeon: Thera FlakeW D Caffrey Jr., MD;  Location: [facility];  Service: Orthopedics;  Laterality: Right;    Current Outpatient Rx  Name  Route  Sig  Dispense  Refill  . albuterol (PROAIR HFA) 108 (90 BASE) MCG/ACT inhaler   Inhalation   Inhale 2 puffs into the lungs every 6 (six) hours as needed for wheezing.   1 Inhaler   3   . ALPRAZolam (XANAX) 0.5 MG tablet      TAKE ONE (1) TABLET THREE (3) TIMES EACHDAY AS NEEDED FOR SLEEP OR ANXIETY   90 tablet   1   . carisoprodol (SOMA) 350 MG tablet   Oral   Take 350 mg by mouth 2 (two) times daily as needed for muscle spasms.         . cetirizine (ZYRTEC) 10 MG tablet   Oral   Take 1 tablet (10 mg total) by mouth daily. Patient taking differently: Take 5 mg by mouth daily as needed.    90 tablet   3   . citalopram (CELEXA) 20 MG tablet   Oral   Take 1  tablet (20 mg total) by mouth daily. Patient taking differently: Take 10 mg by mouth daily.    90 tablet   3   . esomeprazole (NEXIUM) 40 MG capsule   Oral   Take 1 capsule (40 mg total) by mouth at bedtime.   90 capsule   3   . fexofenadine (ALLEGRA) 180 MG tablet   Oral   Take 1 tablet (180 mg total) by mouth daily.   90 tablet   3   . gabapentin (NEURONTIN) 300 MG capsule   Oral   Take 1 capsule (300 mg total) by mouth 3 (three) times daily.   270 capsule   1   . levothyroxine (SYNTHROID, LEVOTHROID) 50 MCG tablet   Oral   Take 1 tablet (50 mcg total) by mouth daily before breakfast.   90 tablet   3   . meclizine (ANTIVERT) 25 MG tablet   Oral   Take 1 tablet (25 mg total) by mouth 3 (three) times daily as needed for dizziness. Patient taking differently: Take 25 mg by mouth 4 (four) times daily as needed for dizziness.    30 tablet   3   . meloxicam (MOBIC) 15 MG tablet   Oral   Take 1  tablet (15 mg total) by mouth daily.   90 tablet   0   . montelukast (SINGULAIR) 10 MG tablet   Oral   Take 1 tablet (10 mg total) by mouth at bedtime. Take 1 by mouth daily   90 tablet   3   . ondansetron (ZOFRAN) 4 MG tablet   Oral   Take 1 tablet (4 mg total) by mouth every 8 (eight) hours as needed for nausea or vomiting.   90 tablet   3   . triamterene-hydrochlorothiazide (DYAZIDE) 37.5-25 MG per capsule   Oral   Take 1 each (1 capsule total) by mouth daily.   90 capsule   3   . valACYclovir (VALTREX) 500 MG tablet   Oral   Take 1 tablet (500 mg total) by mouth every other day.   45 tablet   3     Allergies Ciprofloxacin hcl; Phenergan; Codeine; Erythromycin; Penicillins; Promethazine hcl; Sulfonamide derivatives; and Ultram  Family History  Problem Relation Age of Onset  . Arthritis    . Hyperlipidemia Father   . Coronary artery disease    . Hypertension    . Leukemia Mother   . Hyperlipidemia Mother     Social History History  Substance Use Topics  . Smoking status: Former Smoker    Quit date: 02/28/1985  . Smokeless tobacco: Never Used  . Alcohol Use: No    Review of Systems Constitutional: No fever/chills Eyes: No visual changes. ENT: No sore throat. Cardiovascular: Denies chest pain. Respiratory: Denies shortness of breath. Gastrointestinal: No abdominal pain.  Nausea.  No diarrhea.  No constipation. Genitourinary: Burning with urination. No incontinence. Musculoskeletal: Achy low back pain. Patient notes she does have a chronic history of "arthritis" in her back, it seems to be slightly worse today. Skin: Negative for rash. Neurological: Negative for headaches, focal weakness or numbness. No changes in bowel habits. No weakness in arms or legs.  10-point ROS otherwise negative.  ____________________________________________   PHYSICAL EXAM:  VITAL SIGNS: ED Triage Vitals  Enc Vitals Group     BP 07/05/14 0229 154/76 mmHg     Pulse  Rate 07/05/14 0229 96     Resp 07/05/14 0229 22     Temp  07/05/14 0229 98 F (36.7 C)     Temp Source 07/05/14 0229 Oral     SpO2 07/05/14 0229 99 %     Weight 07/05/14 0229 177 lb (80.287 kg)     Height 07/05/14 0229 5\' 2"  (1.575 m)     Head Cir --      Peak Flow --      Pain Score 07/05/14 0229 8     Pain Loc --      Pain Edu? --      Excl. in GC? --     Constitutional: Alert and oriented. Well appearing and in no acute distress. Eyes: Conjunctivae are normal. PERRL. EOMI. Head: Atraumatic. Nose: No congestion/rhinnorhea. Mouth/Throat: Mucous membranes are moist.  Oropharynx non-erythematous. Neck: No stridor.   Cardiovascular: Normal rate, regular rhythm. Grossly normal heart sounds.  Good peripheral circulation. Respiratory: Normal respiratory effort.  No retractions. Lungs CTAB. Gastrointestinal: Soft and nontender except for minimal tenderness suprapubic. No distention. No abdominal bruits. No CVA tenderness. Musculoskeletal: No lower extremity tenderness nor edema.  No joint effusions. Normal motor sensory exam lower extremities bilaterally. Neurologic:  Normal speech and language. No gross focal neurologic deficits are appreciated. Speech is normal. No gait instability. Skin:  Skin is warm, dry and intact. No rash noted. Psychiatric: Mood and affect are normal. Speech and behavior are normal.  ____________________________________________   LABS (all labs ordered are listed, but only abnormal results are displayed)  Labs Reviewed  URINALYSIS COMPLETEWITH MICROSCOPIC ([facility] ONLY) - Abnormal; Notable for the following:    Color, Urine STRAW (*)    APPearance CLEAR (*)    Specific Gravity, Urine 1.004 (*)    Squamous Epithelial / LPF 0-5 (*)    All other components within normal limits  BASIC METABOLIC PANEL - Abnormal; Notable for the following:    Sodium 132 (*)    Chloride 93 (*)    Glucose, Bld 133 (*)    Creatinine, Ser 1.14 (*)    GFR calc non Af Amer 52 (*)     All other components within normal limits   ____________________________________________  EKG   ____________________________________________  RADIOLOGY   ____________________________________________   PROCEDURES  Procedure(s) performed: None  Critical Care performed: No  ____________________________________________   INITIAL IMPRESSION / ASSESSMENT AND PLAN / ED COURSE  Pertinent labs & imaging results that were available during my care of the patient were reviewed by me and considered in my medical decision making (see chart for details).  Patient presents with dysuria and achy lower back pain for one day. She states she's had the same symptoms many times with urinary tract infections.  Patient's symptoms most consistent with urinary tract infection, interestingly enough her urinalysis does not demonstrate acute infection, however based on the history she is giving it seems reasonable to treat her given her clinical concerns. I do not believe this is related to her back pain, she has no red flags for low back pain, she has normal distal motor sensory exam.  Discussed the patient treatment options. She has indicated success with previous treatment with Keflex for urinary tract infections. She has a penicillin allergy, but no allergy to cephalosporins.  Discussed with the patient treatment, return precautions, and she'll follow-up with her primary care physician on Tuesday.  ----------------------------------------- 7:21 AM on 07/05/2014 -----------------------------------------  Patient awake and alert. She states she feels well. Her nausea is much improved, she tolerated Keflex well. She agrees to follow-up with her doctor early this week, return precautions  discussed. Patient is very agreeable. Patient did have questions about medications and side effects, I did answer these for her.   ____________________________________________   FINAL CLINICAL IMPRESSION(S) / ED  DIAGNOSES  Final diagnoses:  Dysuria      Sharyn Creamer, MD 07/05/14 [phone]

## 2014-07-10 ENCOUNTER — Ambulatory Visit: Payer: Medicare HMO

## 2014-07-17 ENCOUNTER — Ambulatory Visit
Admission: RE | Admit: 2014-07-17 | Discharge: 2014-07-17 | Disposition: A | Discharge status: Home / Self Care | Payer: Medicare HMO | Source: Ambulatory Visit | Attending: Family Medicine | Admitting: Family Medicine

## 2014-07-17 DIAGNOSIS — Z1239 Encounter for other screening for malignant neoplasm of breast: Secondary | ICD-10-CM

## 2014-07-17 DIAGNOSIS — Z1231 Encounter for screening mammogram for malignant neoplasm of breast: Secondary | ICD-10-CM | POA: Insufficient documentation

## 2014-08-13 ENCOUNTER — Encounter: Payer: Self-pay | Admitting: Gastroenterology

## 2014-09-19 ENCOUNTER — Other Ambulatory Visit: Payer: Self-pay | Admitting: *Deleted

## 2014-09-19 MED ORDER — VALACYCLOVIR HCL 500 MG PO TABS: 500.0000 mg | ORAL_TABLET | ORAL | Status: AC

## 2014-10-08 ENCOUNTER — Other Ambulatory Visit: Payer: Self-pay | Admitting: Family Medicine

## 2014-10-08 NOTE — Telephone Encounter (Signed)
Left message asking that patient call back and schedule an appointment.

## 2014-10-08 NOTE — Telephone Encounter (Signed)
Patient needs appointment for refill of controlled substance. Please call pt and inform her.  Latrelle Dodrill, MD

## 2014-10-09 ENCOUNTER — Other Ambulatory Visit: Payer: Self-pay | Admitting: Family Medicine

## 2014-10-28 ENCOUNTER — Other Ambulatory Visit: Payer: Self-pay | Admitting: Family Medicine

## 2015-01-13 ENCOUNTER — Encounter: Payer: Self-pay | Admitting: Gastroenterology

## 2015-01-14 ENCOUNTER — Other Ambulatory Visit: Payer: Self-pay | Admitting: Family Medicine

## 2015-03-18 ENCOUNTER — Ambulatory Visit: Payer: Medicare HMO | Admitting: Gastroenterology

## 2015-04-07 ENCOUNTER — Encounter: Payer: Self-pay | Admitting: Gastroenterology

## 2015-06-02 ENCOUNTER — Ambulatory Visit (INDEPENDENT_AMBULATORY_CARE_PROVIDER_SITE_OTHER): Payer: Medicare HMO | Admitting: Gastroenterology

## 2015-06-02 ENCOUNTER — Encounter: Payer: Self-pay | Admitting: Gastroenterology

## 2015-06-02 VITALS — BP 122/74 | HR 68 | Ht 59.75 in | Wt 158.5 lb

## 2015-06-02 DIAGNOSIS — K589 Irritable bowel syndrome without diarrhea: Secondary | ICD-10-CM

## 2015-06-02 DIAGNOSIS — R634 Abnormal weight loss: Secondary | ICD-10-CM | POA: Diagnosis not present

## 2015-06-02 DIAGNOSIS — R110 Nausea: Secondary | ICD-10-CM | POA: Diagnosis not present

## 2015-06-02 DIAGNOSIS — R1033 Periumbilical pain: Secondary | ICD-10-CM | POA: Diagnosis not present

## 2015-06-02 MED ORDER — DICYCLOMINE HCL 10 MG PO CAPS: 10.0000 mg | ORAL_CAPSULE | Freq: Three times a day (TID) | ORAL | Status: DC

## 2015-06-02 NOTE — Progress Notes (Addendum)
    History of Present Illness: This is a 60 year old female referred by Dr. Joen LauraLaura Bliss for the evaluation of intermittent nausea, abdominal pain and weight loss. She has IBS and stopped Levsin as it once was associated with chest pain. She has also stopped taking Bentyl previously due to a mild dry mouth. Colonoscopy and EGD performed in 02/2008 were normal. Has severe nausea every several months sometimes associated with periumbilical pain and diarrhea. Sometimes associated with frequent urination. Has Meniere's disease followed by Dr. Wardell HonourPilsbury, [facility]. No active GERD symptoms. Denies constipation, change in stool caliber, melena, hematochezia, nausea, vomiting, dysphagia, reflux symptoms, chest pain.  Abd/pelvic CT 10/14/13 IMPRESSION:  1. No explanation for patient's left lower quadrant abdominal pain. Specifically, no evidence of enteric or urinary obstruction.  2. Colonic diverticulosis without evidence of diverticulitis.  3. Moderate to severe DDD of L4-L5.   Review of Systems: Pertinent positive and negative review of systems were noted in the above HPI section. All other review of systems were otherwise negative.  Current Medications, Allergies, Past Medical History, Past Surgical History, Family History and Social History were reviewed in [facility] electronic medical record.  Physical Exam: General: Well developed, well nourished, no acute distress Head: Normocephalic and atraumatic Eyes:  sclerae anicteric, EOMI Ears: Normal auditory acuity Mouth: No deformity or lesions Neck: Supple, no masses or thyromegaly Lungs: Clear throughout to auscultation Heart: Regular rate and rhythm; no murmurs, rubs or bruits Abdomen: Soft, non tender and non distended. No masses, hepatosplenomegaly or hernias noted. Normal Bowel sounds Musculoskeletal: Symmetrical with no gross deformities  Skin: No lesions on visible extremities Pulses:  Normal pulses noted Extremities: No clubbing,  cyanosis, edema or deformities noted Neurological: Alert oriented x 4, grossly nonfocal Cervical Nodes:  No significant cervical adenopathy Inguinal Nodes: No significant inguinal adenopathy Psychological:  Alert and cooperative. Normal mood and affect  Assessment and Recommendations:  1. Episodic severe nausea, intermittent periumbilical abdominal pain and gradual weight loss. Abd/pelvic CT in 10/2013 was unremarkable. CMP, CBC, TSH today. Suspect flares of Meniere's disease leading to severe nausea and pt has follow up appt with her ENT in early May. IBS likely causing intermittent abdominal pain however severe nausea is not related to her IBS. Resume Bentyl 10 mg tid prn for abdominal pain. Continue ranitidine 150 mg po bid for mild GERD however this is not the cause of her severe nausea. If her severe, episodic nausea does not improve following ENT re-evaluation consider repeat EGD. If her weight loss persists consider EGD and abdominal imaging.  cc: Dr. Joen LauraLaura Bliss

## 2015-06-02 NOTE — Patient Instructions (Addendum)
We have sent the following medications to your pharmacy for you to pick up at your convenience:Bentyl.  Please come back to our lab in the basement for lab work at your earliest convenience.   Normal BMI (Body Mass Index- based on height and weight) is between 19 and 25. Your BMI today is Body mass index is 31.2 kg/(m^2). Marland Kitchen. Please consider follow up  regarding your BMI with your Primary Care Provider.   Thank you for choosing me and [facility].  Venita LickMalcolm T. Pleas KochStark, Jr., MD., Clementeen GrahamFACG

## 2015-06-03 NOTE — Addendum Note (Signed)
Addended by: Jessee AversLEWELLYN, Natania Finigan L on: 06/03/2015 09:24 AM   Modules accepted: Orders

## 2015-06-25 ENCOUNTER — Other Ambulatory Visit: Payer: Self-pay | Admitting: Family Medicine

## 2015-06-25 DIAGNOSIS — Z1231 Encounter for screening mammogram for malignant neoplasm of breast: Secondary | ICD-10-CM

## 2015-07-21 ENCOUNTER — Ambulatory Visit: Payer: Medicare HMO

## 2015-07-28 ENCOUNTER — Ambulatory Visit: Payer: Medicare HMO

## 2015-08-13 ENCOUNTER — Ambulatory Visit: Payer: Medicare HMO

## 2015-08-25 ENCOUNTER — Ambulatory Visit: Payer: Medicare HMO

## 2015-09-03 ENCOUNTER — Ambulatory Visit: Payer: Medicare HMO

## 2015-12-01 ENCOUNTER — Ambulatory Visit: Payer: Medicare HMO

## 2016-01-05 ENCOUNTER — Ambulatory Visit
Admission: RE | Admit: 2016-01-05 | Discharge: 2016-01-05 | Disposition: A | Discharge status: Home / Self Care | Payer: Medicare HMO | Source: Ambulatory Visit | Attending: Family Medicine | Admitting: Family Medicine

## 2016-01-05 DIAGNOSIS — Z1231 Encounter for screening mammogram for malignant neoplasm of breast: Secondary | ICD-10-CM | POA: Diagnosis not present

## 2016-06-24 ENCOUNTER — Telehealth: Payer: Self-pay | Admitting: Gastroenterology

## 2016-06-24 NOTE — Telephone Encounter (Signed)
Received referral for patient to come in for repeat colon. Last colon was in 2010 with Dr.Stark but no recall is in system. Please advise for when this pt is done for next colon.

## 2016-06-24 NOTE — Telephone Encounter (Signed)
See pathology report below from 2010.  Please advise recall colonoscopy date     Surgical pathology converted EChart  Order: [id number]  Status:  Final result Visible to patient:  No (Not Released) Next appt:  None  Narrative   [facility] [address] [address] [place], [place] [id number] Telephone [phone] or [phone] Fax [phone]) [phone]  REPORT OF SURGICAL PATHOLOGY  Case #: [id number] Patient Name: Jenna Garner, Jenna Office Chart Number: N/A  MRN: [id number] Pathologist: Ferd HibbsJoshua B. Colonel BaldKish, MD DOB/Age 39/08/1955 (Age: [age]) Gender: F Date Taken: 03/03/2008 Date Received: 03/04/2008  FINAL DIAGNOSIS  miICROSCOPIC EXAMINATION AND DIAGNOSIS  1. COLON, SIGMOID, BIOPSY: - FRAGMENT OF BENIGN COLONIC MUCOSA WITH ASSOCIATED FIBRINOPURULENT MATERIAL. - THERE IS NO EVIDENCE OF MALIGNANCY. - SEE COMMENT.  2. COLON, SIGMOID, BIOPSY: - INFLAMED POLYPOID COLORECTAL MUCOSA WITH SURFACE ULCERATION. - THERE IS NO EVIDENCE OF ADENOMATOUS CHANGE. - SEE COMMENT.  COMMENT 1. The histologic features are nonspecific but are compatible with tissue from an ulcer bed.  2. The specimen contains a lymphoplasmacytic infiltrate in the lamina propria with associated eosinophils. This has a polypoid architecture and is associated with surface epithelial erosion. Adenomatous change is not identified. (JK:jy) 03/05/08  jy Date Reported: 03/05/2008 Ferd HibbsJoshua B. Colonel BaldKish, MD  Electronically Signed Out By JBK  Clinical information Rule out adenoma. Surveillance (mj)  specimen(s) obtained 1: Colon, biopsy, sigmoid 2: Colon, biopsy, sigmoid  Gross Description 1. Received in formalin are tan, soft tissue fragments that are submitted in toto. Number: two. Size: each 0.3 cm.  2. Received in formalin are tan, soft tissue fragments that are submitted in toto. Number: two. Size: 0.2 and 0.3 cm. (GP:gt, 03/04/08)

## 2016-06-26 NOTE — Telephone Encounter (Signed)
As per my 02/2008 colonoscopy note since polyp was not precancerous the next colonoscopy is 02/2018.  If her family history has changed or if she has new symptoms will reassess that recommendation.

## 2016-11-22 ENCOUNTER — Encounter (HOSPITAL_COMMUNITY): Payer: Self-pay | Admitting: *Deleted

## 2016-11-22 ENCOUNTER — Ambulatory Visit (HOSPITAL_COMMUNITY)
Admission: EM | Admit: 2016-11-22 | Discharge: 2016-11-22 | Disposition: A | Discharge status: Home / Self Care | Payer: Medicare HMO

## 2016-11-22 ENCOUNTER — Emergency Department (HOSPITAL_COMMUNITY)
Admission: EM | Admit: 2016-11-22 | Discharge: 2016-11-23 | Disposition: A | Discharge status: Home / Self Care | Payer: Medicare HMO | Attending: Emergency Medicine | Admitting: Emergency Medicine

## 2016-11-22 DIAGNOSIS — Z87891 Personal history of nicotine dependence: Secondary | ICD-10-CM | POA: Diagnosis not present

## 2016-11-22 DIAGNOSIS — J45909 Unspecified asthma, uncomplicated: Secondary | ICD-10-CM | POA: Diagnosis not present

## 2016-11-22 DIAGNOSIS — R1032 Left lower quadrant pain: Secondary | ICD-10-CM

## 2016-11-22 DIAGNOSIS — Z79899 Other long term (current) drug therapy: Secondary | ICD-10-CM | POA: Diagnosis not present

## 2016-11-22 DIAGNOSIS — E039 Hypothyroidism, unspecified: Secondary | ICD-10-CM | POA: Diagnosis not present

## 2016-11-22 LAB — COMPREHENSIVE METABOLIC PANEL
ALT: 22 U/L (ref 14–54)
AST: 27 U/L (ref 15–41)
Albumin: 4.6 g/dL (ref 3.5–5.0)
Alkaline Phosphatase: 70 U/L (ref 38–126)
Anion gap: 11 (ref 5–15)
BILIRUBIN TOTAL: 1.6 mg/dL — AB (ref 0.3–1.2)
BUN: 6 mg/dL (ref 6–20)
CO2: 26 mmol/L (ref 22–32)
Calcium: 9.5 mg/dL (ref 8.9–10.3)
Chloride: 92 mmol/L — ABNORMAL LOW (ref 101–111)
Creatinine, Ser: 0.85 mg/dL (ref 0.44–1.00)
GFR calc Af Amer: >60 mL/min (ref >60)
GLUCOSE: 101 mg/dL — AB (ref 65–99)
Potassium: 3.4 mmol/L — ABNORMAL LOW (ref 3.5–5.1)
Sodium: 129 mmol/L — ABNORMAL LOW (ref 135–145)
TOTAL PROTEIN: 7.4 g/dL (ref 6.5–8.1)

## 2016-11-22 LAB — CBC
HEMATOCRIT: 40.1 % (ref 36.0–46.0)
Hemoglobin: 14 g/dL (ref 12.0–15.0)
MCH: 33.3 pg (ref 26.0–34.0)
MCHC: 34.9 g/dL (ref 30.0–36.0)
MCV: 95.5 fL (ref 78.0–100.0)
PLATELETS: 267 10*3/uL (ref 150–400)
RBC: 4.2 MIL/uL (ref 3.87–5.11)
RDW: 12.2 % (ref 11.5–15.5)
WBC: 6.9 10*3/uL (ref 4.0–10.5)

## 2016-11-22 LAB — TYPE AND SCREEN
ABO/RH(D): AB POS
Antibody Screen: NEGATIVE

## 2016-11-22 LAB — POC OCCULT BLOOD, ED: Fecal Occult Bld: NEGATIVE

## 2016-11-22 LAB — ABO/RH: ABO/RH(D): AB POS

## 2016-11-22 NOTE — ED Triage Notes (Signed)
Pt has been having black tarry stools since Saturday. Pt reports feeling very weak. Pt has 4-5 episodes of black tarry stools.

## 2016-11-22 NOTE — ED Provider Notes (Signed)
[facility] EMERGENCY DEPARTMENT Provider Note   CSN: [id number] Arrival date & time: 11/22/16  1947     History   Chief Complaint Chief Complaint  Patient presents with  . Melena    HPI Jenna Garner is a 61 y.o. female.  Patient presents to the emergency department for evaluation of abdominal discomfort and dark stools. Patient reports that symptoms began 3 days ago.She has been having sensation of "sour stomach". She initially had diarrhea, stools are less watery now but have become dark and black. Patient complaining of pain in the left lower portion of her abdomen. She has not had fever. She has had nausea but no vomiting. She has taken Zofran at home and that had helped the nausea. She did take Kaopectate for the diarrhea.      Past Medical History:  Diagnosis Date  . Allergic rhinitis   . Anal fissure   . Anxiety   . Arthritis   . Asthma   . Colon polyps   . Depression    overlap with GAD  . Diverticulosis   . Genital warts   . GERD   . HOH (hard of hearing)   . Hyperlipidemia   . Hypothyroid   . Insomnia   . Irritable bowel syndrome   . Meniere's disease   . Migraines   . OSA (obstructive sleep apnea)    uses a cpap  . PONV (postoperative nausea and vomiting)   . PTSD (post-traumatic stress disorder)     Patient Active Problem List   Diagnosis Date Noted  . Genital herpes 04/12/2014  . Chronic hyponatremia 10/03/2013  . Elevated BP 09/19/2013  . Ganglion cyst of wrist 05/30/2013  . Hyponatremia 08/24/2012  . Asthma   . Depression   . Hyperlipidemia   . Migraines   . Hypothyroid   . Genital warts   . Allergic rhinitis   . Meniere's disease   . PTSD (post-traumatic stress disorder)   . OSA (obstructive sleep apnea)   . GERD 01/14/2008  . Irritable bowel syndrome 01/14/2008    Past Surgical History:  Procedure Laterality Date  . ABDOMINAL HYSTERECTOMY    . APPENDECTOMY  1981  . BUNIONECTOMY    . CARPAL TUNNEL RELEASE  Right 12/04/2013   Procedure: RIGHT WRIST CARPAL TUNNEL RELEASE/EXCISION GANGLION WRIST PRIMARY;  Surgeon: Thera Flake., MD;  Location: [facility];  Service: Orthopedics;  Laterality: Right;  . CHOLECYSTECTOMY  1981  . INNER EAR SURGERY  1993  . Polyp removed from colon  1964 & 2010  . TONSILLECTOMY  1976  . TUBAL LIGATION      OB History    No data available       Home Medications    Prior to Admission medications   Medication Sig Start Date End Date Taking? Authorizing Provider  albuterol (PROAIR HFA) 108 (90 BASE) MCG/ACT inhaler Inhale 2 puffs into the lungs every 6 (six) hours as needed for wheezing. 04/12/14   Latrelle Dodrill, MD  ALPRAZolam Prudy Feeler) 0.5 MG tablet TAKE ONE (1) TABLET THREE (3) TIMES EACHDAY AS NEEDED FOR SLEEP OR ANXIETY 04/09/14   Latrelle Dodrill, MD  carisoprodol (SOMA) 350 MG tablet Take 350 mg by mouth 2 (two) times daily as needed for muscle spasms.    [provider]  cetirizine (ZYRTEC) 10 MG tablet Take 1 tablet (10 mg total) by mouth daily. Patient taking differently: Take 5 mg by mouth daily as needed.  04/12/14  Latrelle Dodrill, MD  citalopram (CELEXA) 20 MG tablet Take 1 tablet (20 mg total) by mouth daily. Patient taking differently: Take 10 mg by mouth daily.  04/12/14   Latrelle Dodrill, MD  dicyclomine (BENTYL) 10 MG capsule Take 1 capsule (10 mg total) by mouth 3 (three) times daily before meals. 06/02/15   Meryl Dare, MD  fexofenadine (ALLEGRA) 180 MG tablet Take 1 tablet (180 mg total) by mouth daily. 04/12/14   Latrelle Dodrill, MD  gabapentin (NEURONTIN) 300 MG capsule TAKE 1 CAPSULE THREE TIMES DAILY 10/28/14   Latrelle Dodrill, MD  levothyroxine (SYNTHROID, LEVOTHROID) 50 MCG tablet TAKE 1 TABLET DAILY BEFORE BREAKFAST. 01/15/15   Latrelle Dodrill, MD  meclizine (ANTIVERT) 25 MG tablet Take 1 tablet (25 mg total) by mouth 3 (three) times daily as needed for dizziness. Patient taking  differently: Take 25 mg by mouth 4 (four) times daily as needed for dizziness.  03/01/13   Charm Rings, MD  montelukast (SINGULAIR) 10 MG tablet Take 1 tablet (10 mg total) by mouth at bedtime. Take 1 by mouth daily 04/12/14   Latrelle Dodrill, MD  ondansetron (ZOFRAN) 4 MG tablet Take 1 tablet (4 mg total) by mouth every 8 (eight) hours as needed for nausea or vomiting. 01/29/14   Latrelle Dodrill, MD  ranitidine (ZANTAC) 150 MG tablet Take 150 mg by mouth 2 (two) times daily.    [provider]  triamterene-hydrochlorothiazide (DYAZIDE) 37.5-25 MG per capsule Take 1 each (1 capsule total) by mouth daily. 04/12/14   Latrelle Dodrill, MD  valACYclovir (VALTREX) 500 MG tablet Take 1 tablet (500 mg total) by mouth every other day. 09/19/14   Latrelle Dodrill, MD    Family History Family History  Problem Relation Age of Onset  . Breast cancer Maternal Uncle 40       lymphoma as well  . Breast cancer Cousin 17       maternal/ lung ca as well  . Arthritis Unknown   . Hypertension Unknown   . Heart disease Other   . Hyperlipidemia Father   . Colon polyps Father   . Irritable bowel syndrome Father   . Leukemia Mother   . Hyperlipidemia Mother   . Irritable bowel syndrome Mother   . Breast cancer Maternal Aunt 90  . Colon cancer Paternal Grandmother        mets to liver  . Crohn's disease Maternal Aunt   . Diabetes Unknown     Social History Social History  Substance Use Topics  . Smoking status: Former Smoker    Types: Cigarettes    Quit date: 02/28/1985  . Smokeless tobacco: Never Used  . Alcohol use No     Allergies   Ciprofloxacin hcl; Phenergan [promethazine hcl]; Codeine; Erythromycin; Penicillins; Promethazine hcl; Sulfonamide derivatives; and Ultram [tramadol]   Review of Systems Review of Systems  Gastrointestinal: Positive for abdominal pain, diarrhea and nausea.  All other systems reviewed and are negative.    Physical Exam Updated Vital  Signs BP (!) 141/78   Pulse 74   Temp 97.6 F (36.4 C) (Oral)   Resp 16   Wt 71.7 kg (158 lb)   SpO2 98%   BMI 31.12 kg/m   Physical Exam  Constitutional: She is oriented to person, place, and time. She appears well-developed and well-nourished. No distress.  HENT:  Head: Normocephalic and atraumatic.  Right Ear: Hearing normal.  Left Ear: Hearing normal.  Nose:  Nose normal.  Mouth/Throat: Oropharynx is clear and moist and mucous membranes are normal.  Eyes: Pupils are equal, round, and reactive to light. Conjunctivae and EOM are normal.  Neck: Normal range of motion. Neck supple.  Cardiovascular: Regular rhythm, S1 normal and S2 normal.  Exam reveals no gallop and no friction rub.   No murmur heard. Pulmonary/Chest: Effort normal and breath sounds normal. No respiratory distress. She exhibits no tenderness.  Abdominal: Soft. Normal appearance and bowel sounds are normal. There is no hepatosplenomegaly. There is tenderness in the left lower quadrant. There is no rebound, no guarding, no tenderness at McBurney's point and negative Murphy's sign. No hernia.  Musculoskeletal: Normal range of motion.  Neurological: She is alert and oriented to person, place, and time. She has normal strength. No cranial nerve deficit or sensory deficit. Coordination normal. GCS eye subscore is 4. GCS verbal subscore is 5. GCS motor subscore is 6.  Skin: Skin is warm, dry and intact. No rash noted. No cyanosis.  Psychiatric: She has a normal mood and affect. Her speech is normal and behavior is normal. Thought content normal.  Nursing note and vitals reviewed.    ED Treatments / Results  Labs (all labs ordered are listed, but only abnormal results are displayed) Labs Reviewed  COMPREHENSIVE METABOLIC PANEL - Abnormal; Notable for the following:       Result Value   Sodium 129 (*)    Potassium 3.4 (*)    Chloride 92 (*)    Glucose, Bld 101 (*)    Total Bilirubin 1.6 (*)    All other components  within normal limits  CBC  URINALYSIS, ROUTINE W REFLEX MICROSCOPIC  POC OCCULT BLOOD, ED  TYPE AND SCREEN  ABO/RH    EKG  EKG Interpretation None       Radiology Ct Abdomen Pelvis W Contrast  Result Date: 11/23/2016 CLINICAL DATA:  61 y/o  F; black tarry stools. EXAM: CT ABDOMEN AND PELVIS WITH CONTRAST TECHNIQUE: Multidetector CT imaging of the abdomen and pelvis was performed using the standard protocol following bolus administration of intravenous contrast. CONTRAST:  100 cc Isovue-300 COMPARISON:  10/14/2013 CT abdomen and pelvis. FINDINGS: Lower chest: No acute abnormality. Hepatobiliary: No focal liver abnormality is seen. Status post cholecystectomy. No biliary dilatation. Pancreas: Unremarkable. No pancreatic ductal dilatation or surrounding inflammatory changes. Spleen: Normal in size without focal abnormality. Adrenals/Urinary Tract: Adrenal glands are unremarkable. Kidneys are normal, without renal calculi, focal lesion, or hydronephrosis. Bladder is unremarkable. Stomach/Bowel: Stomach is within normal limits. Mild sigmoid diverticulosis, no findings of acute diverticulitis. Appendectomy. No evidence of bowel wall thickening, distention, or inflammatory changes. Vascular/Lymphatic: Aortic atherosclerosis. No enlarged abdominal or pelvic lymph nodes. Reproductive: Status post hysterectomy. No adnexal masses. Other: No abdominal wall hernia or abnormality. No abdominopelvic ascites. Musculoskeletal: Moderate lumbar dextrocurvature with apex at L3. Multilevel lumbar discogenic degenerative changes with severe loss of disc space height at the L1-2 and L4-5 levels. IMPRESSION: 1. No acute process identified. 2. Mild sigmoid diverticulosis without findings of acute diverticulitis. 3. Aortic atherosclerosis. 4. Stable advanced lumbar spine degenerative changes. Electronically Signed   By: Mitzi Hansen M.D.   On: 11/23/2016 02:03    Procedures Procedures (including critical  care time)  Medications Ordered in ED Medications  iopamidol (ISOVUE-300) 61 % injection (100 mLs  Contrast Given 11/23/16 0120)     Initial Impression / Assessment and Plan / ED Course  I have reviewed the triage vital signs and the nursing notes.  Pertinent  labs & imaging results that were available during my care of the patient were reviewed by me and considered in my medical decision making (see chart for details).     atient presents with several days of abdominal discomfort. She had nausea without vomiting and she experienced diarrhea. Patient concerned about black stools. She did, however, take Kaopectate. Rectal exam revealed darkish green stool that was heme-negative.  Patient is, however, experiencing left lower quadrant tenderness and pain, does have a history of diverticulitis. Blood work is unremarkable.CT scan performed to further evaluate. No acute process identified. Patient reassured, likely viral illness causing her initial diarrhea, no evidence of GI bleeding.  Final Clinical Impressions(s) / ED Diagnoses   Final diagnoses:  Left lower quadrant pain    New Prescriptions New Prescriptions   No medications on file     Gilda Crease, MD 11/23/16 [phone]

## 2016-11-23 ENCOUNTER — Encounter (HOSPITAL_COMMUNITY): Payer: Self-pay | Admitting: Radiology

## 2016-11-23 ENCOUNTER — Emergency Department (HOSPITAL_COMMUNITY): Payer: Medicare HMO

## 2016-11-23 LAB — URINALYSIS, ROUTINE W REFLEX MICROSCOPIC
Bilirubin Urine: NEGATIVE
GLUCOSE, UA: NEGATIVE mg/dL
HGB URINE DIPSTICK: NEGATIVE
Ketones, ur: NEGATIVE mg/dL
LEUKOCYTES UA: NEGATIVE
Nitrite: NEGATIVE
PH: 6 (ref 5.0–8.0)
Protein, ur: NEGATIVE mg/dL
Specific Gravity, Urine: 1.004 — ABNORMAL LOW (ref 1.005–1.030)

## 2016-11-23 MED ORDER — DICYCLOMINE HCL 20 MG PO TABS: 20.0000 mg | ORAL_TABLET | Freq: Three times a day (TID) | ORAL | 0 refills | Status: DC | PRN

## 2016-11-23 MED ORDER — IOPAMIDOL (ISOVUE-300) INJECTION 61%
INTRAVENOUS | Status: AC
  Administered 2016-11-23: 100 mL
  Filled 2016-11-23: qty 100

## 2016-11-23 MED ORDER — DIPHENOXYLATE-ATROPINE 2.5-0.025 MG PO TABS: 1.0000 | ORAL_TABLET | Freq: Four times a day (QID) | ORAL | 0 refills | Status: DC | PRN

## 2016-11-23 NOTE — ED Notes (Signed)
Pt verbalizes understanding of d/c instructions. Pt received prescriptions. Pt taken out to lobby in wheelchair for d/c with all belongings.

## 2016-11-23 NOTE — ED Notes (Signed)
Patient transported to CT 

## 2016-11-29 ENCOUNTER — Other Ambulatory Visit: Payer: Self-pay | Admitting: Family Medicine

## 2016-11-29 DIAGNOSIS — Z1231 Encounter for screening mammogram for malignant neoplasm of breast: Secondary | ICD-10-CM

## 2017-01-06 ENCOUNTER — Ambulatory Visit
Admission: RE | Admit: 2017-01-06 | Discharge: 2017-01-06 | Disposition: A | Discharge status: Home / Self Care | Payer: Medicare HMO | Source: Ambulatory Visit | Attending: Family Medicine | Admitting: Family Medicine

## 2017-01-06 DIAGNOSIS — Z1231 Encounter for screening mammogram for malignant neoplasm of breast: Secondary | ICD-10-CM

## 2017-06-15 DIAGNOSIS — R200 Anesthesia of skin: Secondary | ICD-10-CM | POA: Insufficient documentation

## 2017-11-27 ENCOUNTER — Other Ambulatory Visit: Payer: Self-pay | Admitting: Family Medicine

## 2017-11-27 DIAGNOSIS — Z1231 Encounter for screening mammogram for malignant neoplasm of breast: Secondary | ICD-10-CM

## 2018-02-27 ENCOUNTER — Ambulatory Visit
Admission: RE | Admit: 2018-02-27 | Discharge: 2018-02-27 | Disposition: A | Discharge status: Home / Self Care | Payer: Medicare HMO | Source: Ambulatory Visit | Attending: Family Medicine | Admitting: Family Medicine

## 2018-02-27 DIAGNOSIS — Z1231 Encounter for screening mammogram for malignant neoplasm of breast: Secondary | ICD-10-CM

## 2018-04-06 ENCOUNTER — Encounter: Payer: Self-pay | Admitting: Gastroenterology

## 2019-05-30 ENCOUNTER — Other Ambulatory Visit: Payer: Self-pay | Admitting: Family Medicine

## 2019-05-30 DIAGNOSIS — Z1231 Encounter for screening mammogram for malignant neoplasm of breast: Secondary | ICD-10-CM

## 2019-06-17 ENCOUNTER — Inpatient Hospital Stay: Admission: RE | Admit: 2019-06-17 | Payer: Medicare HMO | Source: Ambulatory Visit

## 2019-06-24 ENCOUNTER — Ambulatory Visit: Payer: Medicare HMO

## 2019-07-30 ENCOUNTER — Ambulatory Visit: Payer: Medicare HMO

## 2019-08-06 ENCOUNTER — Encounter (INDEPENDENT_AMBULATORY_CARE_PROVIDER_SITE_OTHER): Payer: Self-pay

## 2019-08-06 ENCOUNTER — Ambulatory Visit
Admission: RE | Admit: 2019-08-06 | Discharge: 2019-08-06 | Disposition: A | Discharge status: Home / Self Care | Payer: Medicare HMO | Source: Ambulatory Visit | Attending: Family Medicine | Admitting: Family Medicine

## 2019-08-06 ENCOUNTER — Other Ambulatory Visit: Payer: Self-pay

## 2019-08-06 DIAGNOSIS — Z1231 Encounter for screening mammogram for malignant neoplasm of breast: Secondary | ICD-10-CM

## 2020-07-14 ENCOUNTER — Other Ambulatory Visit: Payer: Self-pay | Admitting: Family Medicine

## 2020-07-14 DIAGNOSIS — Z1231 Encounter for screening mammogram for malignant neoplasm of breast: Secondary | ICD-10-CM

## 2020-07-21 ENCOUNTER — Ambulatory Visit: Payer: Medicare HMO | Admitting: Gastroenterology

## 2020-07-21 ENCOUNTER — Encounter: Payer: Self-pay | Admitting: Gastroenterology

## 2020-07-21 VITALS — BP 110/72 | HR 75 | Ht 60.0 in | Wt 159.0 lb

## 2020-07-21 DIAGNOSIS — Z01818 Encounter for other preprocedural examination: Secondary | ICD-10-CM

## 2020-07-21 DIAGNOSIS — Z1212 Encounter for screening for malignant neoplasm of rectum: Secondary | ICD-10-CM | POA: Diagnosis not present

## 2020-07-21 DIAGNOSIS — Z1211 Encounter for screening for malignant neoplasm of colon: Secondary | ICD-10-CM

## 2020-07-21 MED ORDER — PLENVU 140 G PO SOLR: 1.0000 | Freq: Once | ORAL | 0 refills | Status: AC

## 2020-07-21 MED ORDER — GOLYTELY 236 G PO SOLR: 4000.0000 mL | Freq: Once | ORAL | 0 refills | Status: AC

## 2020-07-21 NOTE — Patient Instructions (Signed)
You have been scheduled for a colonoscopy. Please follow written instructions given to you at your visit today.  Please pick up your prep supplies at the pharmacy within the next 1-3 days. If you use inhalers (even only as needed), please bring them with you on the day of your procedure.  Normal BMI (Body Mass Index- based on height and weight) is between 19 and 25. Your BMI today is Body mass index is 31.05 kg/m. Marland Kitchen Please consider follow up  regarding your BMI with your Primary Care Provider.  Thank you for choosing me and [facility].  Venita Lick. Pleas Koch., MD., Clementeen Graham

## 2020-07-21 NOTE — Progress Notes (Signed)
History of Present Illness: This is a 65 year old female referred by Dortha Kern, MD for the evaluation of for CRC screening.  She is accompanied by her husband.  Colonoscopy performed in January 2010 showed mild sigmoid colon diverticulosis, 1 small sigmoid colon polyp which was not precancerous and 1 small sigmoid colon erosion.  She relates occasional episodes of mild diarrhea which are then followed by a mild constipation.  These symptoms have not changed in years.  She has no other gastrointestinal complaints. Denies weight loss, abdominal pain, change in stool caliber, melena, hematochezia, nausea, vomiting, dysphagia, reflux symptoms, chest pain.    Allergies  Allergen Reactions   Ciprofloxacin Hcl Anaphylaxis   Phenergan [Promethazine Hcl] Anaphylaxis    Throat closing in   Codeine     REACTION: ABD Pain , Hallucinations   Erythromycin     REACTION: SOB   Penicillins     REACTION: hives   Promethazine Hcl     REACTION: thorat swelling   Sulfonamide Derivatives     REACTION: SOB   Ultram [Tramadol] Other (See Comments)    Severe headaches   Outpatient Medications Prior to Visit  Medication Sig Dispense Refill   albuterol (PROAIR HFA) 108 (90 BASE) MCG/ACT inhaler Inhale 2 puffs into the lungs every 6 (six) hours as needed for wheezing. 1 Inhaler 3   ALPRAZolam (XANAX) 0.5 MG tablet TAKE ONE (1) TABLET THREE (3) TIMES EACHDAY AS NEEDED FOR SLEEP OR ANXIETY 90 tablet 1   carisoprodol (SOMA) 350 MG tablet Take 350 mg by mouth 2 (two) times daily as needed for muscle spasms.     citalopram (CELEXA) 20 MG tablet Take 1 tablet (20 mg total) by mouth daily. (Patient taking differently: Take 10 mg by mouth daily.) 90 tablet 3   diphenoxylate-atropine (LOMOTIL) 2.5-0.025 MG tablet Take 1 tablet by mouth 4 (four) times daily as needed for diarrhea or loose stools. 30 tablet 0   fexofenadine (ALLEGRA) 180 MG tablet Take 1 tablet (180 mg total) by mouth daily. 90 tablet 3    gabapentin (NEURONTIN) 300 MG capsule TAKE 1 CAPSULE THREE TIMES DAILY 270 capsule 0   levothyroxine (SYNTHROID, LEVOTHROID) 50 MCG tablet TAKE 1 TABLET DAILY BEFORE BREAKFAST. 90 tablet 1   montelukast (SINGULAIR) 10 MG tablet Take 1 tablet (10 mg total) by mouth at bedtime. Take 1 by mouth daily 90 tablet 3   ondansetron (ZOFRAN) 4 MG tablet Take 1 tablet (4 mg total) by mouth every 8 (eight) hours as needed for nausea or vomiting. 90 tablet 3   pantoprazole (PROTONIX) 40 MG tablet Take 40 mg by mouth daily.     triamterene-hydrochlorothiazide (DYAZIDE) 37.5-25 MG per capsule Take 1 each (1 capsule total) by mouth daily. 90 capsule 3   valACYclovir (VALTREX) 500 MG tablet Take 1 tablet (500 mg total) by mouth every other day. 45 tablet 3   cetirizine (ZYRTEC) 10 MG tablet Take 1 tablet (10 mg total) by mouth daily. (Patient not taking: Reported on 07/21/2020) 90 tablet 3   dicyclomine (BENTYL) 10 MG capsule Take 1 capsule (10 mg total) by mouth 3 (three) times daily before meals. (Patient not taking: Reported on 07/21/2020) 90 capsule 11   dicyclomine (BENTYL) 20 MG tablet Take 1 tablet (20 mg total) by mouth 3 (three) times daily as needed for spasms. (Patient not taking: Reported on 07/21/2020) 20 tablet 0   meclizine (ANTIVERT) 25 MG tablet Take 1 tablet (25 mg total) by mouth 3 (three) times  daily as needed for dizziness. (Patient not taking: Reported on 07/21/2020) 30 tablet 3   ranitidine (ZANTAC) 150 MG tablet Take 150 mg by mouth 2 (two) times daily. (Patient not taking: No sig reported)     No facility-administered medications prior to visit.   Past Medical History:  Diagnosis Date   Allergic rhinitis    Anal fissure    Anxiety    Arthritis    Asthma    Colon polyps    Depression    overlap with GAD   Diverticulosis    Genital warts    GERD    HOH (hard of hearing)    Hyperlipidemia    Hypothyroid    Insomnia    Irritable bowel syndrome    Meniere's disease    Migraines     OSA (obstructive sleep apnea)    uses a cpap   PONV (postoperative nausea and vomiting)    PTSD (post-traumatic stress disorder)    Past Surgical History:  Procedure Laterality Date   ABDOMINAL HYSTERECTOMY     APPENDECTOMY  1981   BUNIONECTOMY     CARPAL TUNNEL RELEASE Right 12/04/2013   Procedure: RIGHT WRIST CARPAL TUNNEL RELEASE/EXCISION GANGLION WRIST PRIMARY;  Surgeon: Thera Flake., MD;  Location: [facility];  Service: Orthopedics;  Laterality: Right;   CHOLECYSTECTOMY  1981   INNER EAR SURGERY  1993   Polyp removed from colon  1964 & 2010   TONSILLECTOMY  1976   TUBAL LIGATION     Social History   Socioeconomic History   Marital status: Married    Spouse name: Not on file   Number of children: 2   Years of education: Not on file   Highest education level: Not on file  Occupational History   Not on file  Tobacco Use   Smoking status: Former    Pack years: 0.00    Types: Cigarettes    Quit date: 02/28/1985    Years since quitting: 35.4   Smokeless tobacco: Never  Substance and Sexual Activity   Alcohol use: No    Alcohol/week: 0.0 standard drinks   Drug use: No   Sexual activity: Not Currently    Birth control/protection: Surgical  Other Topics Concern   Not on file  Social History Narrative   Not on file   Social Determinants of Health   Financial Resource Strain: Not on file  Food Insecurity: Not on file  Transportation Needs: Not on file  Physical Activity: Not on file  Stress: Not on file  Social Connections: Not on file   Family History  Problem Relation Age of Onset   Breast cancer Maternal Uncle 40       lymphoma as well   Breast cancer Cousin 62       maternal/ lung ca as well   Arthritis Other    Hypertension Other    Heart disease Other    Hyperlipidemia Father    Colon polyps Father    Irritable bowel syndrome Father    Leukemia Mother    Hyperlipidemia Mother    Irritable bowel syndrome Mother    Breast cancer  Maternal Aunt 52   Colon cancer Paternal Grandmother        mets to liver   Diabetes Other        Review of Systems: Pertinent positive and negative review of systems were noted in the above HPI section. All other review of systems were otherwise negative.   Physical  Exam: General: Well developed, well nourished, no acute distress Head: Normocephalic and atraumatic Eyes: Sclerae anicteric, EOMI Ears: Normal auditory acuity Mouth: Not examined, mask on during Covid-19 pandemic Neck: Supple, no masses or thyromegaly Lungs: Clear throughout to auscultation Heart: Regular rate and rhythm; no murmurs, rubs or bruits Abdomen: Soft, non tender and non distended. No masses, hepatosplenomegaly or hernias noted. Normal Bowel sounds Rectal: Deferred to colonoscopy Musculoskeletal: Symmetrical with no gross deformities  Skin: No lesions on visible extremities Pulses:  Normal pulses noted Extremities: No clubbing, cyanosis, edema or deformities noted Neurological: Alert oriented x 4, grossly nonfocal Cervical Nodes:  No significant cervical adenopathy Inguinal Nodes: No significant inguinal adenopathy Psychological:  Alert and cooperative. Normal mood and affect   Assessment and Recommendations:  CRC screening, average risk.  Schedule colonoscopy.  She relates an egg allergy that causes diarrhea and headaches.  She is concerned about propofol use.  I advised her to further discuss with CRNA on the day of the procedure.  The risks (including bleeding, perforation, infection, missed lesions, medication reactions and possible hospitalization or surgery if complications occur), benefits, and alternatives to colonoscopy with possible biopsy and possible polypectomy were discussed with the patient and they consent to proceed.     cc: Dortha Kern, MD [address] [place],  [place] [address]

## 2020-08-18 ENCOUNTER — Ambulatory Visit: Payer: Medicare HMO

## 2020-09-02 ENCOUNTER — Ambulatory Visit
Admission: RE | Admit: 2020-09-02 | Discharge: 2020-09-02 | Disposition: A | Discharge status: Home / Self Care | Payer: Medicare HMO | Source: Ambulatory Visit | Attending: Family Medicine | Admitting: Family Medicine

## 2020-09-02 ENCOUNTER — Other Ambulatory Visit: Payer: Self-pay

## 2020-09-02 DIAGNOSIS — Z1231 Encounter for screening mammogram for malignant neoplasm of breast: Secondary | ICD-10-CM | POA: Diagnosis not present

## 2020-09-16 ENCOUNTER — Other Ambulatory Visit: Payer: Self-pay

## 2020-09-16 ENCOUNTER — Emergency Department (HOSPITAL_COMMUNITY)
Admission: EM | Admit: 2020-09-16 | Discharge: 2020-09-16 | Disposition: A | Discharge status: Home / Self Care | Payer: Medicare HMO | Attending: Emergency Medicine | Admitting: Emergency Medicine

## 2020-09-16 ENCOUNTER — Encounter (HOSPITAL_COMMUNITY): Payer: Self-pay

## 2020-09-16 DIAGNOSIS — Z87891 Personal history of nicotine dependence: Secondary | ICD-10-CM | POA: Diagnosis not present

## 2020-09-16 DIAGNOSIS — J45909 Unspecified asthma, uncomplicated: Secondary | ICD-10-CM | POA: Insufficient documentation

## 2020-09-16 DIAGNOSIS — E039 Hypothyroidism, unspecified: Secondary | ICD-10-CM | POA: Insufficient documentation

## 2020-09-16 DIAGNOSIS — U071 COVID-19: Secondary | ICD-10-CM | POA: Diagnosis not present

## 2020-09-16 DIAGNOSIS — Z79899 Other long term (current) drug therapy: Secondary | ICD-10-CM | POA: Insufficient documentation

## 2020-09-16 DIAGNOSIS — R110 Nausea: Secondary | ICD-10-CM | POA: Insufficient documentation

## 2020-09-16 LAB — COMPREHENSIVE METABOLIC PANEL
ALT: 29 U/L (ref 0–44)
AST: 45 U/L — ABNORMAL HIGH (ref 15–41)
Albumin: 5.4 g/dL — ABNORMAL HIGH (ref 3.5–5.0)
Alkaline Phosphatase: 74 U/L (ref 38–126)
Anion gap: 14 (ref 5–15)
BUN: 9 mg/dL (ref 8–23)
CO2: 28 mmol/L (ref 22–32)
Calcium: 9.9 mg/dL (ref 8.9–10.3)
Chloride: 92 mmol/L — ABNORMAL LOW (ref 98–111)
Creatinine, Ser: 0.86 mg/dL (ref 0.44–1.00)
GFR, Estimated: >60 mL/min (ref >60)
Glucose, Bld: 116 mg/dL — ABNORMAL HIGH (ref 70–99)
Potassium: 4.1 mmol/L (ref 3.5–5.1)
Sodium: 134 mmol/L — ABNORMAL LOW (ref 135–145)
Total Bilirubin: 0.7 mg/dL (ref 0.3–1.2)
Total Protein: 8.7 g/dL — ABNORMAL HIGH (ref 6.5–8.1)

## 2020-09-16 LAB — CBC WITH DIFFERENTIAL/PLATELET
Abs Immature Granulocytes: 0.01 10*3/uL (ref 0.00–0.07)
Basophils Absolute: 0 10*3/uL (ref 0.0–0.1)
Basophils Relative: 0 %
Eosinophils Absolute: 0 10*3/uL (ref 0.0–0.5)
Eosinophils Relative: 0 %
HCT: 43.7 % (ref 36.0–46.0)
Hemoglobin: 14.8 g/dL (ref 12.0–15.0)
Immature Granulocytes: 0 %
Lymphocytes Relative: 34 %
Lymphs Abs: 1.2 10*3/uL (ref 0.7–4.0)
MCH: 33.6 pg (ref 26.0–34.0)
MCHC: 33.9 g/dL (ref 30.0–36.0)
MCV: 99.3 fL (ref 80.0–100.0)
Monocytes Absolute: 0.4 10*3/uL (ref 0.1–1.0)
Monocytes Relative: 10 %
Neutro Abs: 2 10*3/uL (ref 1.7–7.7)
Neutrophils Relative %: 56 %
Platelets: 232 10*3/uL (ref 150–400)
RBC: 4.4 MIL/uL (ref 3.87–5.11)
RDW: 11.7 % (ref 11.5–15.5)
WBC: 3.6 10*3/uL — ABNORMAL LOW (ref 4.0–10.5)
nRBC: 0 % (ref 0.0–0.2)

## 2020-09-16 LAB — LIPASE, BLOOD: Lipase: 37 U/L (ref 11–51)

## 2020-09-16 MED ORDER — ONDANSETRON HCL 4 MG/2ML IJ SOLN
4.0000 mg | Freq: Once | INTRAMUSCULAR | Status: AC
  Administered 2020-09-16: 4 mg via INTRAVENOUS
  Filled 2020-09-16: qty 2

## 2020-09-16 MED ORDER — ONDANSETRON HCL 4 MG PO TABS: 4.0000 mg | ORAL_TABLET | Freq: Three times a day (TID) | ORAL | 0 refills | Status: AC | PRN

## 2020-09-16 MED ORDER — SODIUM CHLORIDE 0.9 % IV BOLUS
1000.0000 mL | Freq: Once | INTRAVENOUS | Status: AC
  Administered 2020-09-16: 1000 mL via INTRAVENOUS

## 2020-09-16 NOTE — ED Triage Notes (Signed)
Covid + 8/4. Patient having congestion, weakness, bodyaches, nausea and diarrhea x1 day. Denies headahce, shob, fever

## 2020-09-16 NOTE — Discharge Instructions (Addendum)
You have been evaluated for your symptoms due to COVID infection.  Please take Zofran as needed for your nausea.  Follow instruction below.  Return if you have any concern.  Recommendations for at home COVID-19 symptoms management:  Please continue isolation at home. Call [phone] to see whether you might be eligible for therapeutic antibody infusions (leave your name and they will call you back).  If have acute worsening of symptoms please go to ER/urgent care for further evaluation. Check pulse oximetry and if below 90-92% please go to ER. The following supplements MAY help:  Vitamin C 500mg  twice a day and Quercetin 250-500 mg twice a day Vitamin D3 2000 - 4000 u/day B Complex vitamins Zinc 75-100 mg/day Melatonin 6-10 mg at night (the optimal dose is unknown) Aspirin 81mg /day (if no history of bleeding issues)

## 2020-09-16 NOTE — ED Notes (Signed)
ED Provider at bedside. 

## 2020-09-16 NOTE — ED Provider Notes (Signed)
[facility] Provider Note   CSN: [id number] Arrival date & time: 09/16/20  [id number]     History Chief Complaint  Patient presents with   Diarrhea   Nausea   Covid Positive    Jenna Garner is a 65 y.o. female.  The history is provided by the patient and medical records. No language interpreter was used.  Diarrhea  65 year old female significant history of asthma, anxiety, depression, hyperlipidemia presenting with covid sxs.  Patient report for the past 6 days she has had headache, sneezing, dry cough, congestion, nausea, multiple bouts of diarrhea, generalized weakness and fatigue.  She also had a positive COVID test at home after her husband test positive for COVID the day before.  She tries taking Zofran at home without relief.  She does not complain of any significant shortness of breath or dysuria.  She has been vaccinated for COVID-19.  Her primary complaint is generalized fatigue and body aches  Past Medical History:  Diagnosis Date   Allergic rhinitis    Anal fissure    Anxiety    Arthritis    Asthma    Colon polyps    Depression    overlap with GAD   Diverticulosis    Genital warts    GERD    HOH (hard of hearing)    Hyperlipidemia    Hypothyroid    Insomnia    Irritable bowel syndrome    Meniere's disease    Migraines    OSA (obstructive sleep apnea)    uses a cpap   PONV (postoperative nausea and vomiting)    PTSD (post-traumatic stress disorder)     Patient Active Problem List   Diagnosis Date Noted   Genital herpes 04/12/2014   Chronic hyponatremia 10/03/2013   Elevated BP 09/19/2013   Ganglion cyst of wrist 05/30/2013   Hyponatremia 08/24/2012   Asthma    Depression    Hyperlipidemia    Migraines    Hypothyroid    Genital warts    Allergic rhinitis    Meniere's disease    PTSD (post-traumatic stress disorder)    OSA (obstructive sleep apnea)    GERD 01/14/2008   Irritable bowel syndrome 01/14/2008     Past Surgical History:  Procedure Laterality Date   ABDOMINAL HYSTERECTOMY     APPENDECTOMY  1981   BUNIONECTOMY     CARPAL TUNNEL RELEASE Right 12/04/2013   Procedure: RIGHT WRIST CARPAL TUNNEL RELEASE/EXCISION GANGLION WRIST PRIMARY;  Surgeon: Thera FlakeW D Caffrey Jr., MD;  Location: [facility];  Service: Orthopedics;  Laterality: Right;   CHOLECYSTECTOMY  1981   INNER EAR SURGERY  1993   Polyp removed from colon  1964 & 2010   TONSILLECTOMY  1976   TUBAL LIGATION       OB History   No obstetric history on file.     Family History  Problem Relation Age of Onset   Breast cancer Maternal Uncle 40       lymphoma as well   Breast cancer Cousin [id number]       maternal/ lung ca as well   Arthritis Other    Hypertension Other    Heart disease Other    Hyperlipidemia Father    Colon polyps Father    Irritable bowel syndrome Father    Leukemia Mother    Hyperlipidemia Mother    Irritable bowel syndrome Mother    Breast cancer Maternal Aunt [id number]   Colon cancer Paternal Grandmother  mets to liver   Diabetes Other     Social History   Tobacco Use   Smoking status: Former    Types: Cigarettes    Quit date: 02/28/1985    Years since quitting: 35.5   Smokeless tobacco: Never  Substance Use Topics   Alcohol use: No    Alcohol/week: 0.0 standard drinks   Drug use: No    Home Medications Prior to Admission medications   Medication Sig Start Date End Date Taking? Authorizing Provider  acetaminophen (TYLENOL) 500 MG tablet Take 1,000 mg by mouth every 6 (six) hours as needed for mild pain.   Yes [provider]  albuterol (PROAIR HFA) 108 (90 BASE) MCG/ACT inhaler Inhale 2 puffs into the lungs every 6 (six) hours as needed for wheezing. 04/12/14  Yes Latrelle Dodrill, MD  ALPHA-LIPOIC ACID PO Take 1 tablet by mouth in the morning, at noon, and at bedtime.   Yes [provider]  ALPRAZolam (XANAX) 0.5 MG tablet TAKE ONE (1) TABLET THREE (3) TIMES  EACHDAY AS NEEDED FOR SLEEP OR ANXIETY Patient taking differently: Take 0.5 mg by mouth 3 (three) times daily as needed for anxiety or sleep. 04/09/14  Yes Latrelle Dodrill, MD  carisoprodol (SOMA) 350 MG tablet Take 350 mg by mouth 2 (two) times daily as needed for muscle spasms.   Yes [provider]  citalopram (CELEXA) 20 MG tablet Take 1 tablet (20 mg total) by mouth daily. 04/12/14  Yes Latrelle Dodrill, MD  Cyanocobalamin (B-12 PO) Take 1 tablet by mouth daily.   Yes [provider]  fexofenadine (ALLEGRA) 180 MG tablet Take 1 tablet (180 mg total) by mouth daily. 04/12/14  Yes Latrelle Dodrill, MD  gabapentin (NEURONTIN) 300 MG capsule TAKE 1 CAPSULE THREE TIMES DAILY Patient taking differently: Take 600 mg by mouth at bedtime. 10/28/14  Yes Latrelle Dodrill, MD  levothyroxine (SYNTHROID, LEVOTHROID) 50 MCG tablet TAKE 1 TABLET DAILY BEFORE BREAKFAST. Patient taking differently: Take 50 mcg by mouth daily in the afternoon. 01/15/15  Yes Latrelle Dodrill, MD  montelukast (SINGULAIR) 10 MG tablet Take 1 tablet (10 mg total) by mouth at bedtime. Take 1 by mouth daily Patient taking differently: Take 10 mg by mouth at bedtime. 04/12/14  Yes Latrelle Dodrill, MD  Multiple Vitamins-Minerals (CENTRUM SILVER PO) Take 1 tablet by mouth daily.   Yes [provider]  ondansetron (ZOFRAN) 4 MG tablet Take 1 tablet (4 mg total) by mouth every 8 (eight) hours as needed for nausea or vomiting. 01/29/14  Yes Latrelle Dodrill, MD  pantoprazole (PROTONIX) 40 MG tablet Take 40 mg by mouth at bedtime.   Yes [provider]  triamterene-hydrochlorothiazide (DYAZIDE) 37.5-25 MG per capsule Take 1 each (1 capsule total) by mouth daily. 04/12/14  Yes Latrelle Dodrill, MD  valACYclovir (VALTREX) 500 MG tablet Take 1 tablet (500 mg total) by mouth every other day. 09/19/14  Yes Latrelle Dodrill, MD  VITAMIN D PO Take 1 tablet by mouth in the morning and at  bedtime.   Yes [provider]  diphenoxylate-atropine (LOMOTIL) 2.5-0.025 MG tablet Take 1 tablet by mouth 4 (four) times daily as needed for diarrhea or loose stools. Patient not taking: No sig reported 11/23/16   Gilda Crease, MD    Allergies    Ciprofloxacin hcl, Phenergan [promethazine hcl], Codeine, Eggs or egg-derived products, Erythromycin, Nsaids, Other, Penicillins, Phenytoin, Prednisone, Promethazine hcl, Propoxyphene, Sulfa antibiotics, Sulfonamide derivatives, and Ultram [  tramadol]  Review of Systems   Review of Systems  Gastrointestinal:  Positive for diarrhea.  All other systems reviewed and are negative.  Physical Exam Updated Vital Signs BP (!) 158/96   Pulse 78   Temp 98.5 F (36.9 C)   Resp 17   Ht 5' (1.524 m)   Wt 72 kg   SpO2 97%   BMI 31.00 kg/m   Physical Exam Vitals and nursing note reviewed.  Constitutional:      General: She is not in acute distress.    Appearance: She is well-developed.  HENT:     Head: Atraumatic.  Eyes:     Conjunctiva/sclera: Conjunctivae normal.  Cardiovascular:     Rate and Rhythm: Normal rate and regular rhythm.     Pulses: Normal pulses.     Heart sounds: Normal heart sounds.  Pulmonary:     Effort: Pulmonary effort is normal.     Breath sounds: Normal breath sounds. No wheezing or rales.  Abdominal:     Palpations: Abdomen is soft.     Tenderness: There is abdominal tenderness (Mild diffuse abdominal tenderness).  Musculoskeletal:     Cervical back: Neck supple.     Comments: Global weakness to all 4 extremities with equal effort  Skin:    Findings: No rash.  Neurological:     Mental Status: She is alert and oriented to person, place, and time.  Psychiatric:        Mood and Affect: Mood normal.    ED Results / Procedures / Treatments   Labs (all labs ordered are listed, but only abnormal results are displayed) Labs Reviewed  CBC WITH DIFFERENTIAL/PLATELET - Abnormal; Notable for the  following components:      Result Value   WBC 3.6 (*)    All other components within normal limits  COMPREHENSIVE METABOLIC PANEL - Abnormal; Notable for the following components:   Sodium 134 (*)    Chloride 92 (*)    Glucose, Bld 116 (*)    Total Protein 8.7 (*)    Albumin 5.4 (*)    AST 45 (*)    All other components within normal limits  LIPASE, BLOOD    EKG None  Radiology No results found.  Procedures Procedures   Medications Ordered in ED Medications - No data to display  ED Course  I have reviewed the triage vital signs and the nursing notes.  Pertinent labs & imaging results that were available during my care of the patient were reviewed by me and considered in my medical decision making (see chart for details).    MDM Rules/Calculators/A&P                           BP (!) 163/74 (BP Location: Left Arm)   Pulse 89   Temp 98.3 F (36.8 C) (Oral)   Resp 20   Ht 5' (1.524 m)   Wt 72 kg   SpO2 99%   BMI 31.00 kg/m   Final Clinical Impression(s) / ED Diagnoses Final diagnoses:  COVID-19 virus infection    Rx / DC Orders ED Discharge Orders          Ordered    ondansetron (ZOFRAN) 4 MG tablet  Every 8 hours PRN        09/16/20 1411           11:12 AM Patient with COVID symptoms for the past 6 days.  Had a positive COVID  test at home.  Husband sick with COVID as well.  She has been vaccinated for COVID-19.  She is here with persistent nausea and diarrhea with generalized fatigue.  Vital signs are reassuring.  We will give IV fluid and provide symptomatic control  2:09 PM Labs are reassuring, after receiving IV fluid, patient tolerates p.o. and felt a bit better.  Unfortunately patient is outside the window for p.o. antiviral medication for her COVID infection.  Will provide symptomatic treatment to go home, and gave return precaution.   Fayrene Helper, PA-C 09/16/20 1412    Franne Forts, DO 09/16/20 2138

## 2020-09-21 ENCOUNTER — Telehealth: Payer: Self-pay | Admitting: Gastroenterology

## 2020-09-21 NOTE — Telephone Encounter (Signed)
Inbound call from patient. Had COVID 8/6. And have had diarrhea the last 7 days

## 2020-09-21 NOTE — Telephone Encounter (Signed)
Called patient back and she states she was in the ED last week with COVID and they gave her Zofran and told her to push fluids, but nothing for the diarrhea. States she has now had it for 7 days. I suggested Imodium to slow the diarrhea down, but she states she can't take it, but she can take Pepto-Bismol. I asked her to take it for 24 hours and if no improvement to call us back

## 2020-10-01 ENCOUNTER — Encounter: Payer: Medicare HMO | Admitting: Gastroenterology

## 2020-11-19 ENCOUNTER — Encounter: Payer: Medicare HMO | Admitting: Gastroenterology

## 2020-12-01 ENCOUNTER — Encounter: Payer: Medicare HMO | Admitting: Gastroenterology

## 2020-12-11 ENCOUNTER — Telehealth: Payer: Self-pay | Admitting: Gastroenterology

## 2020-12-11 NOTE — Telephone Encounter (Signed)
Left message for patient to return my call.

## 2020-12-11 NOTE — Telephone Encounter (Signed)
Patient called wanting to go over her medications to know what she can and \\or  can not take prior to her procedure.

## 2020-12-14 NOTE — Telephone Encounter (Signed)
I have spoken to patient to answer her questions regarding taking medication morning of her procedure. She is advised to take all meds on her list but must take at least 3 hours prior to her procedure. She verbalizes understanding.

## 2020-12-14 NOTE — Telephone Encounter (Signed)
Patient returning your call.

## 2020-12-16 ENCOUNTER — Encounter: Payer: Self-pay | Admitting: Gastroenterology

## 2020-12-22 ENCOUNTER — Telehealth: Payer: Self-pay | Admitting: Gastroenterology

## 2020-12-22 NOTE — Telephone Encounter (Signed)
Sorry... I pressed Dr. Christella Hartigan' name by mistake.  This message is for Dr. Russella Dar.

## 2020-12-22 NOTE — Telephone Encounter (Signed)
Hi Dr. Russella Dar,  This patient called today to cancel colonoscopy for tomorrow as she has the stomach flu.  She did reschedule her appointment to January 14

## 2020-12-22 NOTE — Telephone Encounter (Signed)
This is her 3rd [facility] cancellation. Charge her for this late [facility] cancellation. Cancel her colonoscopy for Jan.14 and inform her that she needs to schedule an office appt to discuss colonoscopy before we can reschedule her.

## 2020-12-23 ENCOUNTER — Encounter: Payer: Medicare HMO | Admitting: Gastroenterology

## 2020-12-24 NOTE — Telephone Encounter (Signed)
Patient's husband is asking for you to consider waiving the cancellation fee?   He had COVID and she had a stomach flu that lead to some of the cancellations.

## 2020-12-24 NOTE — Telephone Encounter (Signed)
OK to waive cancellation fee this time. I am concerned about her 3 procedure cancellations close to the procedure dates over the past few months.

## 2020-12-24 NOTE — Telephone Encounter (Signed)
Please see note below. Patient husband Donnabelle Blanchard called in after the previous call.  States he should not have to pay a cancellation fee after everything he had to go through as one of Dr. Russella Dar patients' as well. He states he would like to discuss the issue that his wife has been having. I advised the Maurine Minister, I offered patient an OV to discuss with Dr. Russella Dar and she declined and stated she didn't want to come back because of that reason. He is adamant he wants to speak to Dr. Russella Dar because he is a wonderful doctor. Best contact number [phone]

## 2020-12-24 NOTE — Telephone Encounter (Signed)
I left a message for the patient's husband that Dr. Russella Dar has waived the fee, but will need OV before scheduling a procedure.

## 2020-12-24 NOTE — Telephone Encounter (Signed)
Patient returned call. Informed her due to late cancellation, there will be a cancellation charge as well as a OV is needed before colonoscopy can be rescheduled. Patient is adamant she have not cancelled but 2 times. I informed patient it has been at least 3 times and due to late cancellation there is a charge. Patient states she called Monday and left a message to cancel the appt. I apologized to patient and informed her of the recommendation again of OV needed beforehand. Patient states she will not be back and states she likes Dr. Russella Dar but does not like how he handles things.

## 2021-02-10 ENCOUNTER — Encounter: Payer: Medicare HMO | Admitting: Gastroenterology

## 2021-03-16 ENCOUNTER — Ambulatory Visit: Payer: Medicare HMO | Admitting: Gastroenterology

## 2021-03-31 ENCOUNTER — Ambulatory Visit: Payer: Medicare HMO | Admitting: Gastroenterology

## 2021-05-24 ENCOUNTER — Telehealth: Payer: Self-pay | Admitting: Family Medicine

## 2021-05-24 NOTE — Telephone Encounter (Signed)
Dr. Plotnikov 

## 2021-05-24 NOTE — Telephone Encounter (Signed)
Pt states provider agreed to accept her as a new pt ? ?Please advise ?

## 2021-05-25 NOTE — Telephone Encounter (Signed)
This she a relative of my other patient?  Thanks ?

## 2021-05-27 NOTE — Telephone Encounter (Signed)
Called Mrs. Range she states she is Mrs. Jenna Garner daughter in law. Mrs. Begnaud told her wonderful things about you.Marland KitchenRaechel Chute ?

## 2021-06-08 NOTE — Telephone Encounter (Signed)
Pt calling in inquiring if provider will accept her as a new pt  ? ?Please advise ?

## 2021-06-17 NOTE — Telephone Encounter (Signed)
PT calls again in regards to this matter. 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  ? ?Current CB: [phone] ? ?Number will change after the 19th to ? ?CB: [phone] ?

## 2021-06-22 NOTE — Telephone Encounter (Signed)
Ok Thx 

## 2021-07-16 NOTE — Telephone Encounter (Signed)
Called number again.. rec'd same msg for schedulers number not in service. Will mail out letter to patient having her to contact us w/ appt.Marland KitchenRaechel Chute

## 2021-08-04 ENCOUNTER — Other Ambulatory Visit: Payer: Self-pay | Admitting: Family Medicine

## 2021-08-04 DIAGNOSIS — Z1231 Encounter for screening mammogram for malignant neoplasm of breast: Secondary | ICD-10-CM

## 2021-08-23 ENCOUNTER — Encounter: Payer: Self-pay | Admitting: Internal Medicine

## 2021-08-23 ENCOUNTER — Ambulatory Visit (INDEPENDENT_AMBULATORY_CARE_PROVIDER_SITE_OTHER): Payer: Medicare HMO | Admitting: Internal Medicine

## 2021-08-23 VITALS — BP 120/82 | HR 72 | Temp 98.0°F | Ht 60.0 in | Wt 164.0 lb

## 2021-08-23 DIAGNOSIS — Z9181 History of falling: Secondary | ICD-10-CM

## 2021-08-23 DIAGNOSIS — F4310 Post-traumatic stress disorder, unspecified: Secondary | ICD-10-CM | POA: Diagnosis not present

## 2021-08-23 DIAGNOSIS — F959 Tic disorder, unspecified: Secondary | ICD-10-CM | POA: Insufficient documentation

## 2021-08-23 DIAGNOSIS — E559 Vitamin D deficiency, unspecified: Secondary | ICD-10-CM | POA: Diagnosis not present

## 2021-08-23 DIAGNOSIS — E039 Hypothyroidism, unspecified: Secondary | ICD-10-CM

## 2021-08-23 DIAGNOSIS — F329 Major depressive disorder, single episode, unspecified: Secondary | ICD-10-CM

## 2021-08-23 DIAGNOSIS — H8103 Meniere's disease, bilateral: Secondary | ICD-10-CM

## 2021-08-23 DIAGNOSIS — R202 Paresthesia of skin: Secondary | ICD-10-CM

## 2021-08-23 DIAGNOSIS — G8929 Other chronic pain: Secondary | ICD-10-CM

## 2021-08-23 DIAGNOSIS — M545 Low back pain, unspecified: Secondary | ICD-10-CM

## 2021-08-23 LAB — COMPREHENSIVE METABOLIC PANEL
ALT: 14 U/L (ref 0–35)
AST: 20 U/L (ref 0–37)
Albumin: 4.8 g/dL (ref 3.5–5.2)
Alkaline Phosphatase: 63 U/L (ref 39–117)
BUN: 15 mg/dL (ref 6–23)
CO2: 29 mEq/L (ref 19–32)
Calcium: 9.6 mg/dL (ref 8.4–10.5)
Chloride: 93 mEq/L — ABNORMAL LOW (ref 96–112)
Creatinine, Ser: 0.85 mg/dL (ref 0.40–1.20)
GFR: 72.01 mL/min (ref >60.00)
Glucose, Bld: 95 mg/dL (ref 70–99)
Potassium: 3.8 mEq/L (ref 3.5–5.1)
Sodium: 131 mEq/L — ABNORMAL LOW (ref 135–145)
Total Bilirubin: 0.9 mg/dL (ref 0.2–1.2)
Total Protein: 7.3 g/dL (ref 6.0–8.3)

## 2021-08-23 LAB — LIPID PANEL
Cholesterol: 203 mg/dL — ABNORMAL HIGH (ref 0–200)
HDL: 41.9 mg/dL (ref >39.00)
NonHDL: 160.83
Total CHOL/HDL Ratio: 5
Triglycerides: 220 mg/dL — ABNORMAL HIGH (ref 0.0–149.0)
VLDL: 44 mg/dL — ABNORMAL HIGH (ref 0.0–40.0)

## 2021-08-23 LAB — CBC WITH DIFFERENTIAL/PLATELET
Basophils Absolute: 0 10*3/uL (ref 0.0–0.1)
Basophils Relative: 0.4 % (ref 0.0–3.0)
Eosinophils Absolute: 0.1 10*3/uL (ref 0.0–0.7)
Eosinophils Relative: 1.1 % (ref 0.0–5.0)
HCT: 37.8 % (ref 36.0–46.0)
Hemoglobin: 12.9 g/dL (ref 12.0–15.0)
Lymphocytes Relative: 40.6 % (ref 12.0–46.0)
Lymphs Abs: 2.1 10*3/uL (ref 0.7–4.0)
MCHC: 34.2 g/dL (ref 30.0–36.0)
MCV: 99 fl (ref 78.0–100.0)
Monocytes Absolute: 0.6 10*3/uL (ref 0.1–1.0)
Monocytes Relative: 11.7 % (ref 3.0–12.0)
Neutro Abs: 2.4 10*3/uL (ref 1.4–7.7)
Neutrophils Relative %: 46.2 % (ref 43.0–77.0)
Platelets: 268 10*3/uL (ref 150.0–400.0)
RBC: 3.82 Mil/uL — ABNORMAL LOW (ref 3.87–5.11)
RDW: 13 % (ref 11.5–15.5)
WBC: 5.2 10*3/uL (ref 4.0–10.5)

## 2021-08-23 LAB — URINALYSIS
Bilirubin Urine: NEGATIVE
Hgb urine dipstick: NEGATIVE
Ketones, ur: NEGATIVE
Leukocytes,Ua: NEGATIVE
Nitrite: NEGATIVE
Specific Gravity, Urine: 1.01 (ref 1.000–1.030)
Total Protein, Urine: NEGATIVE
Urine Glucose: NEGATIVE
Urobilinogen, UA: 0.2 (ref 0.0–1.0)
pH: 7.5 (ref 5.0–8.0)

## 2021-08-23 LAB — VITAMIN D 25 HYDROXY (VIT D DEFICIENCY, FRACTURES): VITD: 33.26 ng/mL (ref 30.00–100.00)

## 2021-08-23 LAB — VITAMIN B12: Vitamin B-12: >1504 pg/mL — ABNORMAL HIGH (ref 211–911)

## 2021-08-23 LAB — T4, FREE: Free T4: 0.89 ng/dL (ref 0.60–1.60)

## 2021-08-23 LAB — TSH: TSH: 3.99 u[IU]/mL (ref 0.35–5.50)
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
## 2021-08-23 NOTE — Patient Instructions (Signed)
Chair yoga   Walk 5000 steps a day, try trekking poles

## 2021-08-23 NOTE — Assessment & Plan Note (Addendum)
Discussed Meniere's diease, wt gain Fall prevention - discussed Chair yoga suggested Walk 5000 steps a day, try  trekking poles

## 2021-08-23 NOTE — Progress Notes (Signed)
Subjective:  Patient ID: Jenna Garner, female    DOB: 10-14-1955  Age: 66 y.o. MRN: [id number]  CC: No chief complaint on file.   HPI Jenna Garner presents for a new pt visit  C/o Meniere's diease, wt gain, LBP, hypothyroid  Outpatient Medications Prior to Visit  Medication Sig Dispense Refill   acetaminophen (TYLENOL) 500 MG tablet Take 1,000 mg by mouth every 6 (six) hours as needed for mild pain.     albuterol (PROAIR HFA) 108 (90 BASE) MCG/ACT inhaler Inhale 2 puffs into the lungs every 6 (six) hours as needed for wheezing. 1 Inhaler 3   ALPHA-LIPOIC ACID PO Take 1 tablet by mouth in the morning, at noon, and at bedtime.     ALPRAZolam (XANAX) 0.5 MG tablet TAKE ONE (1) TABLET THREE (3) TIMES EACHDAY AS NEEDED FOR SLEEP OR ANXIETY (Patient taking differently: Take 0.5 mg by mouth 3 (three) times daily as needed for anxiety or sleep.) 90 tablet 1   carisoprodol (SOMA) 350 MG tablet Take 350 mg by mouth 2 (two) times daily as needed for muscle spasms.     citalopram (CELEXA) 20 MG tablet Take 1 tablet (20 mg total) by mouth daily. 90 tablet 3   Cyanocobalamin (B-12 PO) Take 1 tablet by mouth daily.     fexofenadine (ALLEGRA) 180 MG tablet Take 1 tablet (180 mg total) by mouth daily. 90 tablet 3   gabapentin (NEURONTIN) 300 MG capsule TAKE 1 CAPSULE THREE TIMES DAILY (Patient taking differently: Take 600 mg by mouth at bedtime.) 270 capsule 0   levothyroxine (SYNTHROID, LEVOTHROID) 50 MCG tablet TAKE 1 TABLET DAILY BEFORE BREAKFAST. (Patient taking differently: Take 50 mcg by mouth daily in the afternoon.) 90 tablet 1   montelukast (SINGULAIR) 10 MG tablet Take 1 tablet (10 mg total) by mouth at bedtime. Take 1 by mouth daily (Patient taking differently: Take 10 mg by mouth at bedtime.) 90 tablet 3   Multiple Vitamins-Minerals (CENTRUM SILVER PO) Take 1 tablet by mouth daily.     ondansetron (ZOFRAN) 4 MG tablet Take 1 tablet (4 mg total) by mouth every 8 (eight) hours as needed for nausea  or vomiting. 12 tablet 0   pantoprazole (PROTONIX) 40 MG tablet Take 40 mg by mouth at bedtime.     triamterene-hydrochlorothiazide (DYAZIDE) 37.5-25 MG per capsule Take 1 each (1 capsule total) by mouth daily. 90 capsule 3   valACYclovir (VALTREX) 500 MG tablet Take 1 tablet (500 mg total) by mouth every other day. 45 tablet 3   VITAMIN D PO Take 1 tablet by mouth in the morning and at bedtime.     diphenoxylate-atropine (LOMOTIL) 2.5-0.025 MG tablet Take 1 tablet by mouth 4 (four) times daily as needed for diarrhea or loose stools. (Patient not taking: No sig reported) 30 tablet 0   No facility-administered medications prior to visit.    ROS: Review of Systems  Constitutional:  Negative for activity change, appetite change, chills, fatigue and unexpected weight change.  HENT:  Positive for hearing loss and tinnitus. Negative for congestion, mouth sores and sinus pressure.   Eyes:  Negative for visual disturbance.  Respiratory:  Negative for cough and chest tightness.   Gastrointestinal:  Negative for abdominal pain and nausea.  Genitourinary:  Negative for difficulty urinating, frequency and vaginal pain.  Musculoskeletal:  Positive for arthralgias and gait problem. Negative for back pain.  Skin:  Negative for pallor and rash.  Neurological:  Positive for dizziness. Negative for tremors,  weakness, numbness and headaches.  Psychiatric/Behavioral:  Negative for confusion and sleep disturbance.     Objective:  BP 120/82 (BP Location: Left Arm, Patient Position: Sitting, Cuff Size: Normal)   Pulse 72   Temp 98 F (36.7 C) (Oral)   Ht 5' (1.524 m)   Wt 164 lb (74.4 kg)   SpO2 96%   BMI 32.03 kg/m   BP Readings from Last 3 Encounters:  08/23/21 120/82  09/16/20 133/62  07/21/20 110/72    Wt Readings from Last 3 Encounters:  08/23/21 164 lb (74.4 kg)  09/16/20 158 lb 11.7 oz (72 kg)  07/21/20 159 lb (72.1 kg)    Physical Exam Constitutional:      General: She is not in  acute distress.    Appearance: She is well-developed. She is obese.  HENT:     Head: Normocephalic.     Right Ear: External ear normal.     Left Ear: External ear normal.     Nose: Nose normal.  Eyes:     General:        Right eye: No discharge.        Left eye: No discharge.     Conjunctiva/sclera: Conjunctivae normal.     Pupils: Pupils are equal, round, and reactive to light.  Neck:     Thyroid: No thyromegaly.     Vascular: No JVD.     Trachea: No tracheal deviation.  Cardiovascular:     Rate and Rhythm: Normal rate and regular rhythm.     Heart sounds: Normal heart sounds.  Pulmonary:     Effort: No respiratory distress.     Breath sounds: No stridor. No wheezing.  Abdominal:     General: Bowel sounds are normal. There is no distension.     Palpations: Abdomen is soft. There is no mass.     Tenderness: There is no abdominal tenderness. There is no guarding or rebound.  Musculoskeletal:        General: No tenderness.     Cervical back: Normal range of motion and neck supple. No rigidity.  Lymphadenopathy:     Cervical: No cervical adenopathy.  Skin:    Findings: No erythema or rash.  Neurological:     Cranial Nerves: No cranial nerve deficit.     Motor: No abnormal muscle tone.     Coordination: Coordination normal.     Gait: Gait abnormal.     Deep Tendon Reflexes: Reflexes normal.  Psychiatric:        Behavior: Behavior normal.        Thought Content: Thought content normal.        Judgment: Judgment normal.   Antalgic gait Foot pain Caries Ataxic Facial tic  Lab Results  Component Value Date   WBC 3.6 (L) 09/16/2020   HGB 14.8 09/16/2020   HCT 43.7 09/16/2020   PLT 232 09/16/2020   GLUCOSE 116 (H) 09/16/2020   CHOL 238 (H) 09/19/2013   TRIG 289 (H) 09/19/2013   HDL 44 09/19/2013   LDLDIRECT 144 (H) 04/09/2014   LDLCALC 136 (H) 09/19/2013   ALT 29 09/16/2020   AST 45 (H) 09/16/2020   NA 134 (L) 09/16/2020   K 4.1 09/16/2020   CL 92 (L)  09/16/2020   CREATININE 0.86 09/16/2020   BUN 9 09/16/2020   CO2 28 09/16/2020   TSH 2.457 04/09/2014    No results found.  Assessment & Plan:   Problem List Items Addressed This Visit  At moderate risk for fall    Discussed Meniere's diease, wt gain Fall prevention - discussed Chair yoga suggested Walk 5000 steps a day, try  trekking poles       Depression    Overlap with GAD/PTSD On Cytalopram      Relevant Orders   TSH   Urinalysis   CBC with Differential/Platelet   Lipid panel   Comprehensive metabolic panel   VITAMIN D 25 Hydroxy (Vit-D Deficiency, Fractures)   Vitamin B12   T4, free   Facial tic    Chronic. Mild      Hypothyroid    On Levothyroxine Check TSH      Relevant Orders   TSH   Urinalysis   CBC with Differential/Platelet   Lipid panel   Comprehensive metabolic panel   VITAMIN D 25 Hydroxy (Vit-D Deficiency, Fractures)   Vitamin B12   T4, free   Low back pain    Chronic LBP      Meniere's disease    Menier's disease x long time      Relevant Orders   TSH   Urinalysis   CBC with Differential/Platelet   Lipid panel   Comprehensive metabolic panel   VITAMIN D 25 Hydroxy (Vit-D Deficiency, Fractures)   Vitamin B12   T4, free   PTSD (post-traumatic stress disorder)   Other Visit Diagnoses     Paresthesia    -  Primary   Relevant Orders   Vitamin B12   Vitamin D deficiency       Relevant Orders   VITAMIN D 25 Hydroxy (Vit-D Deficiency, Fractures)         No orders of the defined types were placed in this encounter.     Follow-up: Return in about 3 months (around 11/23/2021) for a follow-up visit.  Walker Kehr, MD

## 2021-08-23 NOTE — Assessment & Plan Note (Signed)
Overlap with GAD/PTSD On Cytalopram

## 2021-08-23 NOTE — Assessment & Plan Note (Addendum)
On Levothyroxine Check TSH 

## 2021-08-23 NOTE — Assessment & Plan Note (Signed)
Chronic. Mild

## 2021-08-23 NOTE — Assessment & Plan Note (Signed)
Chronic LBP 

## 2021-08-23 NOTE — Assessment & Plan Note (Signed)
Menier's disease x long time

## 2021-08-24 LAB — LDL CHOLESTEROL, DIRECT: Direct LDL: 123 mg/dL

## 2021-08-25 ENCOUNTER — Telehealth: Payer: Self-pay | Admitting: Internal Medicine

## 2021-08-25 NOTE — Telephone Encounter (Signed)
Pt wanted [place] to know that she gets alprazolam, valtrex, and ondansetron from [facility]. She stated she gets all other medications from [facility].  Fyi

## 2021-09-07 ENCOUNTER — Ambulatory Visit
Admission: RE | Admit: 2021-09-07 | Discharge: 2021-09-07 | Disposition: A | Discharge status: Home / Self Care | Payer: Medicare HMO | Source: Ambulatory Visit | Attending: Family Medicine | Admitting: Family Medicine

## 2021-09-07 DIAGNOSIS — Z1231 Encounter for screening mammogram for malignant neoplasm of breast: Secondary | ICD-10-CM | POA: Insufficient documentation

## 2021-11-18 ENCOUNTER — Ambulatory Visit: Payer: Medicare HMO | Admitting: Internal Medicine

## 2021-11-23 ENCOUNTER — Ambulatory Visit: Payer: Medicare HMO | Admitting: Internal Medicine

## 2021-11-25 ENCOUNTER — Ambulatory Visit: Payer: Medicare HMO | Admitting: Internal Medicine

## 2022-08-25 ENCOUNTER — Other Ambulatory Visit: Payer: Self-pay | Admitting: Family Medicine

## 2022-08-25 DIAGNOSIS — Z1231 Encounter for screening mammogram for malignant neoplasm of breast: Secondary | ICD-10-CM

## 2022-09-20 ENCOUNTER — Ambulatory Visit: Payer: Medicare HMO

## 2022-09-27 ENCOUNTER — Ambulatory Visit
Admission: RE | Admit: 2022-09-27 | Discharge: 2022-09-27 | Disposition: A | Discharge status: Home / Self Care | Payer: Medicare HMO | Source: Ambulatory Visit | Attending: Family Medicine | Admitting: Family Medicine

## 2022-09-27 DIAGNOSIS — Z1231 Encounter for screening mammogram for malignant neoplasm of breast: Secondary | ICD-10-CM | POA: Insufficient documentation

## 2023-02-24 IMAGING — MG MM DIGITAL SCREENING BILAT W/ TOMO AND CAD
8 series · 8 of 24 positions shown · non-contrast
Comparison: Previous exam(s).

CLINICAL DATA: Screening.

EXAM:
DIGITAL SCREENING BILATERAL MAMMOGRAM WITH TOMOSYNTHESIS AND CAD
TECHNIQUE: Bilateral screening digital craniocaudal and mediolateral oblique
mammograms were obtained. Bilateral screening digital breast
tomosynthesis was performed. The images were evaluated with
computer-aided detection.

[L MLO synth-2D]
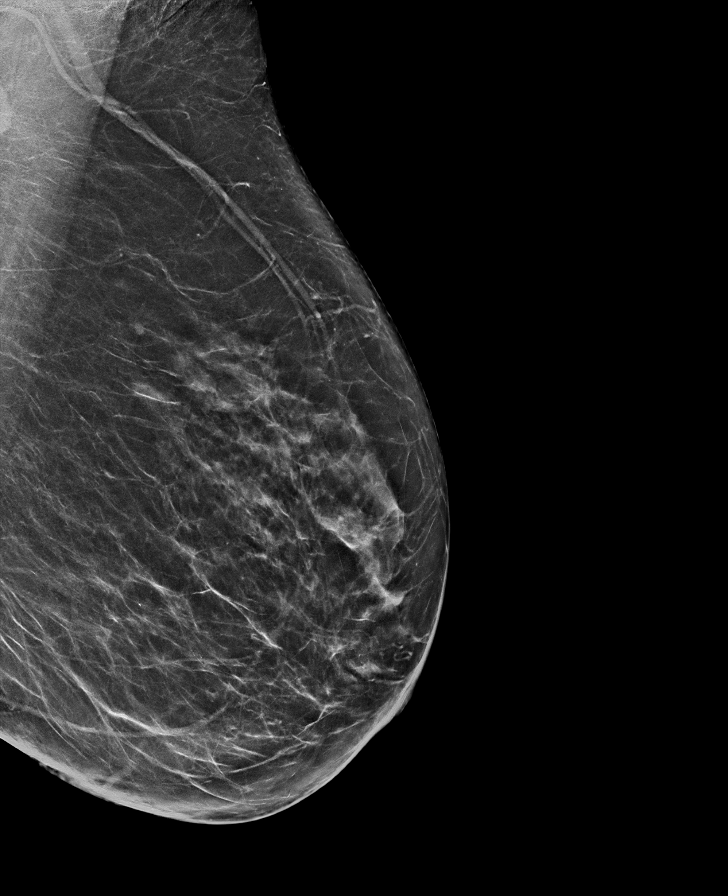

[R CC synth-2D]
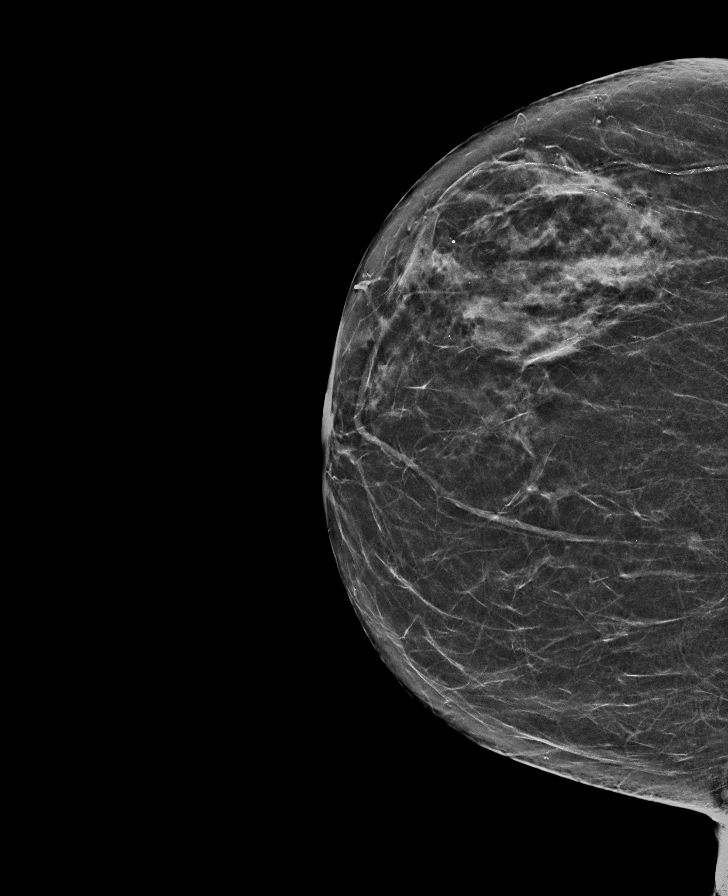

[R MLO synth-2D]
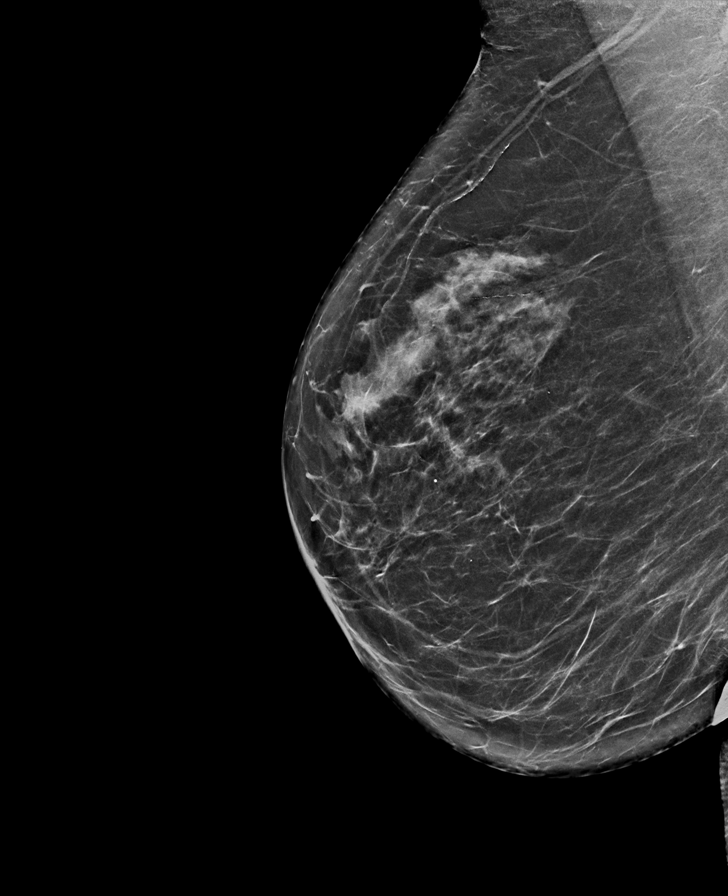

[L CC synth-2D]
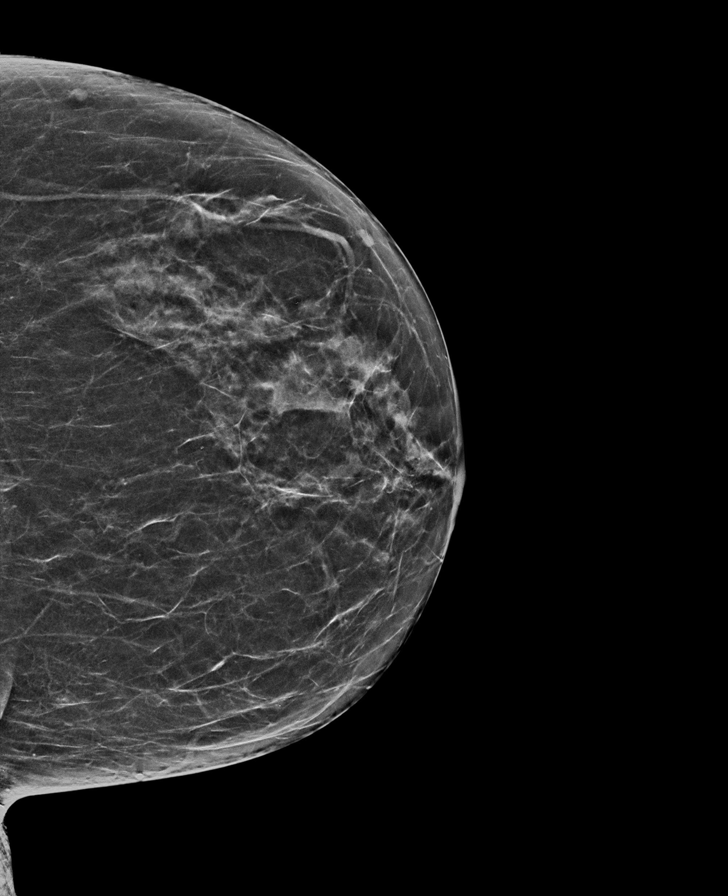

[R CC tomo · tomo slice 30/59.0]
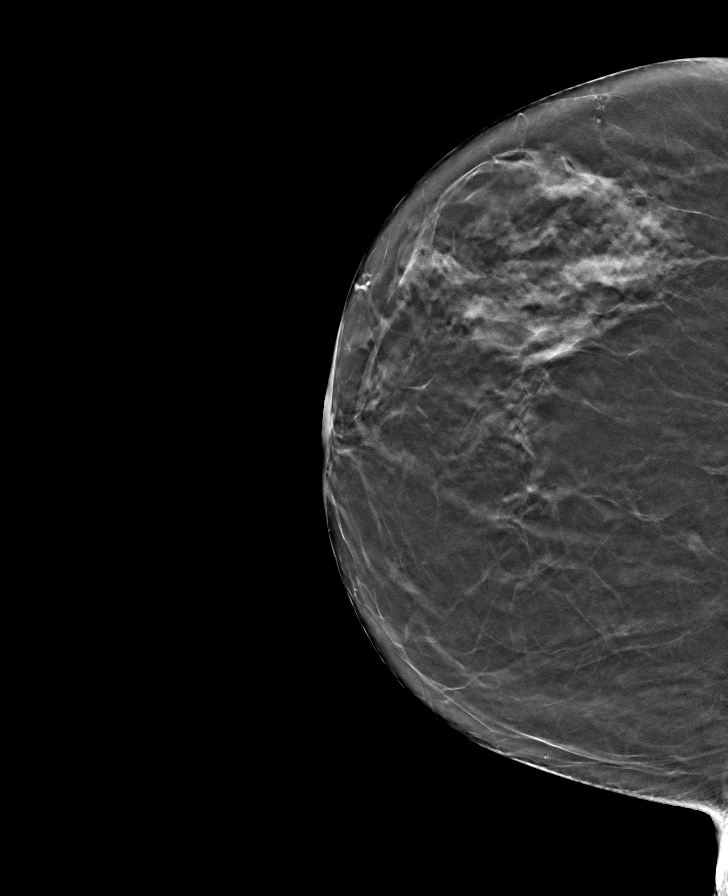

[L CC tomo · tomo slice 31/62.0]
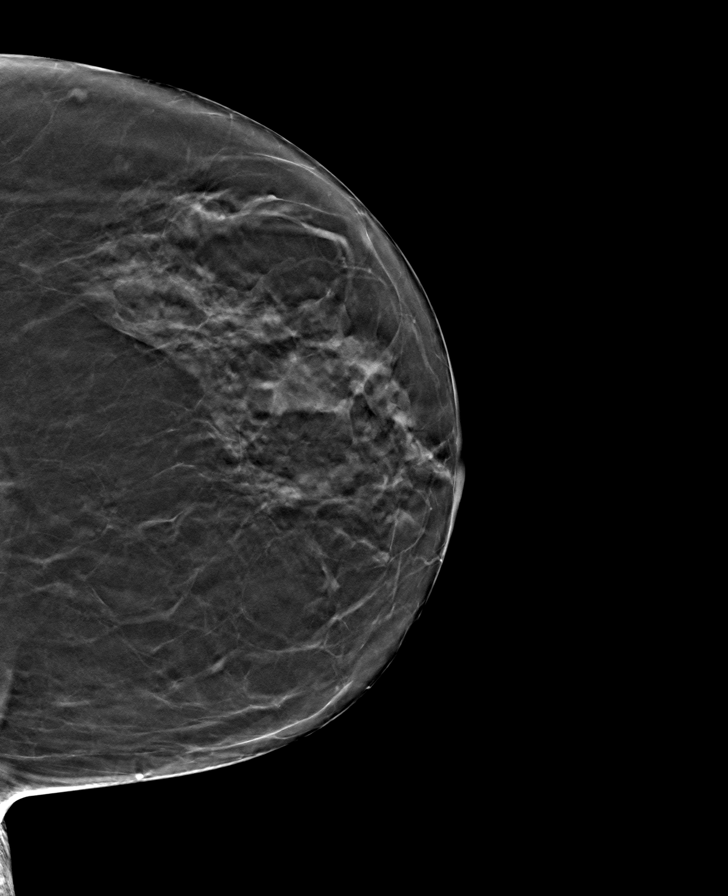

[L MLO tomo · tomo slice 37/72.0]
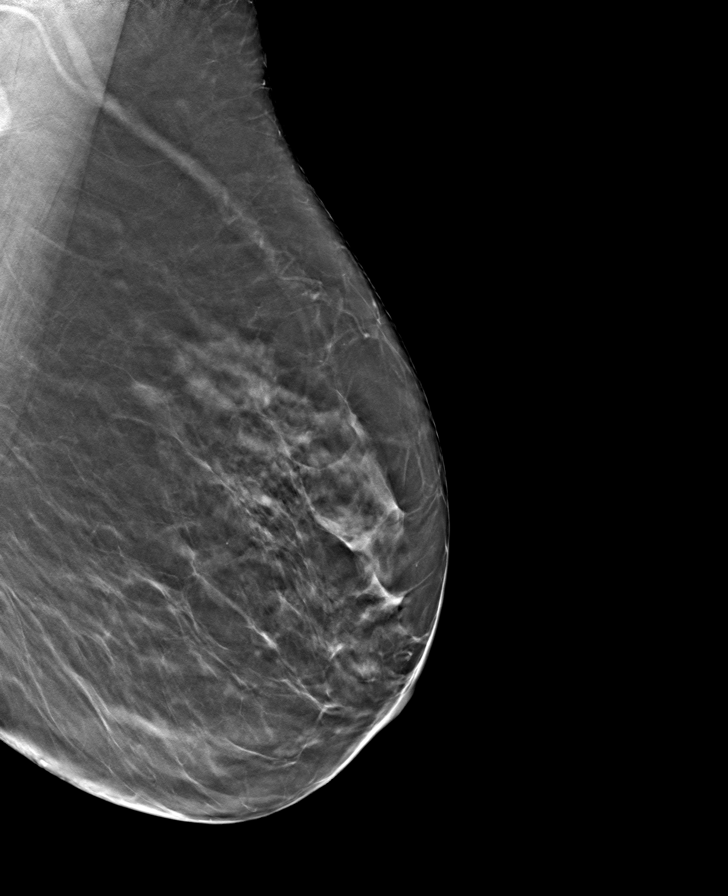

[R MLO tomo · tomo slice 35/70.0]
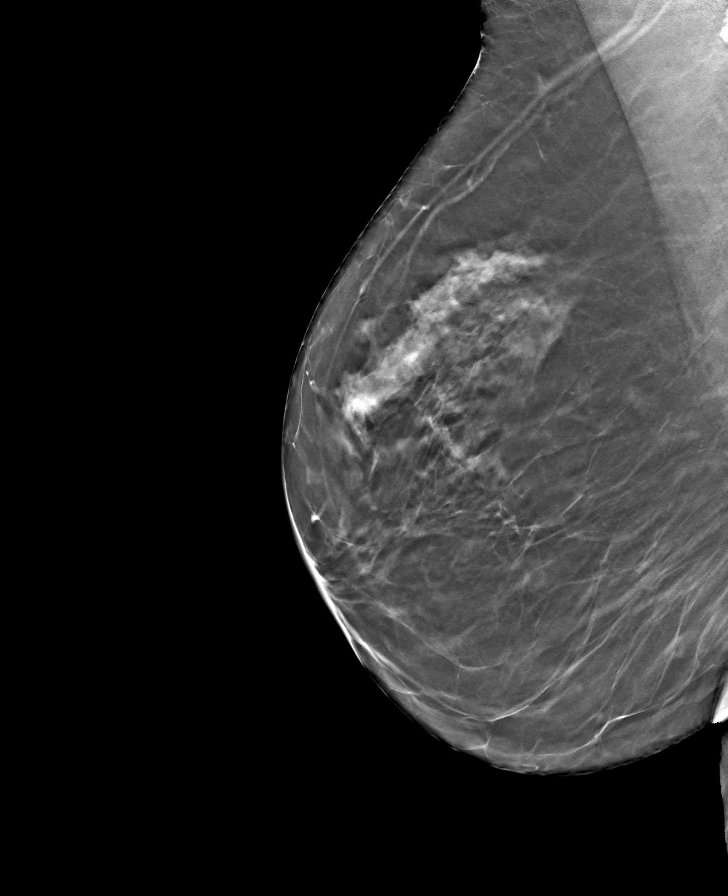

[8 of 24 positions shown; findings below may reference images not displayed]

ACR Breast Density Category c: The breast tissue is heterogeneously
dense, which may obscure small masses.
FINDINGS: There are no findings suspicious for malignancy.
IMPRESSION: No mammographic evidence of malignancy. A result letter of this
screening mammogram will be mailed directly to the patient.

RECOMMENDATION:
Screening mammogram in one year. (Code:Q3-W-BC3)

BI-RADS CATEGORY  1: Negative.

## 2023-06-13 ENCOUNTER — Ambulatory Visit: Payer: Self-pay | Admitting: Podiatry

## 2023-06-20 ENCOUNTER — Ambulatory Visit: Admitting: Podiatry

## 2023-06-27 ENCOUNTER — Ambulatory Visit: Admitting: Podiatry

## 2023-07-11 ENCOUNTER — Ambulatory Visit: Admitting: Podiatry

## 2023-07-11 DIAGNOSIS — M7671 Peroneal tendinitis, right leg: Secondary | ICD-10-CM | POA: Diagnosis not present

## 2023-07-11 NOTE — Progress Notes (Signed)
 Subjective:  Patient ID: Jenna Garner, female    DOB: 05/17/1955,  MRN: [id number]  Chief Complaint  Patient presents with   Foot Pain    Right foot pain pt stated that it has been bothering her for a while     68 y.o. female presents with the above complaint.  Patient presents with complaint of right peroneal tendinitis/ankle instability.  She states been bothering her for a while.  She has a history of ankle sprain has rolled her ankle multiple times.  Wanted to get it evaluated.  She has not seen anyone else prior to seeing me for this.  She states in the past she has been in the boot.  Which helps.  She wanted to get it evaluated pain scale is 5 out of 10 dull aching nature hurts with ambulation.   Review of Systems: Negative except as noted in the HPI. Denies N/V/F/Ch.  Past Medical History:  Diagnosis Date   Allergic rhinitis    Anal fissure    Anxiety    Arthritis    Asthma    Colon polyps    Depression    overlap with GAD   Diverticulosis    Genital warts    GERD    HOH (hard of hearing)    Hyperlipidemia    Hypothyroid    Insomnia    Irritable bowel syndrome    Meniere's disease    Migraines    OSA (obstructive sleep apnea)    uses a cpap   PONV (postoperative nausea and vomiting)    PTSD (post-traumatic stress disorder)     Current Outpatient Medications:    acetaminophen  (TYLENOL ) 500 MG tablet, Take 1,000 mg by mouth every 6 (six) hours as needed for mild pain., Disp: , Rfl:    albuterol  (PROAIR  HFA) 108 (90 BASE) MCG/ACT inhaler, Inhale 2 puffs into the lungs every 6 (six) hours as needed for wheezing., Disp: 1 Inhaler, Rfl: 3   ALPHA-LIPOIC ACID PO, Take 1 tablet by mouth in the morning, at noon, and at bedtime., Disp: , Rfl:    ALPRAZolam  (XANAX ) 0.5 MG tablet, TAKE ONE (1) TABLET THREE (3) TIMES EACHDAY AS NEEDED FOR SLEEP OR ANXIETY (Patient taking differently: Take 0.5 mg by mouth 3 (three) times daily as needed for anxiety or sleep.), Disp: 90 tablet,  Rfl: 1   carisoprodol  (SOMA ) 350 MG tablet, Take 350 mg by mouth 2 (two) times daily as needed for muscle spasms., Disp: , Rfl:    citalopram  (CELEXA ) 20 MG tablet, Take 1 tablet (20 mg total) by mouth daily., Disp: 90 tablet, Rfl: 3   Cyanocobalamin (B-12 PO), Take 1 tablet by mouth daily., Disp: , Rfl:    fexofenadine  (ALLEGRA ) 180 MG tablet, Take 1 tablet (180 mg total) by mouth daily., Disp: 90 tablet, Rfl: 3   gabapentin  (NEURONTIN ) 300 MG capsule, TAKE 1 CAPSULE THREE TIMES DAILY (Patient taking differently: Take 600 mg by mouth at bedtime.), Disp: 270 capsule, Rfl: 0   levothyroxine  (SYNTHROID , LEVOTHROID) 50 MCG tablet, TAKE 1 TABLET DAILY BEFORE BREAKFAST. (Patient taking differently: Take 50 mcg by mouth daily in the afternoon.), Disp: 90 tablet, Rfl: 1   montelukast  (SINGULAIR ) 10 MG tablet, Take 1 tablet (10 mg total) by mouth at bedtime. Take 1 by mouth daily (Patient taking differently: Take 10 mg by mouth at bedtime.), Disp: 90 tablet, Rfl: 3   Multiple Vitamins-Minerals (CENTRUM SILVER PO), Take 1 tablet by mouth daily., Disp: , Rfl:    ondansetron  (  ZOFRAN ) 4 MG tablet, Take 1 tablet (4 mg total) by mouth every 8 (eight) hours as needed for nausea or vomiting., Disp: 12 tablet, Rfl: 0   pantoprazole (PROTONIX) 40 MG tablet, Take 40 mg by mouth at bedtime., Disp: , Rfl:    triamterene -hydrochlorothiazide (DYAZIDE) 37.5-25 MG per capsule, Take 1 each (1 capsule total) by mouth daily., Disp: 90 capsule, Rfl: 3   valACYclovir  (VALTREX ) 500 MG tablet, Take 1 tablet (500 mg total) by mouth every other day., Disp: 45 tablet, Rfl: 3   VITAMIN D  PO, Take 1 tablet by mouth in the morning and at bedtime., Disp: , Rfl:   Social History   Tobacco Use  Smoking Status Former   Current packs/day: 0.00   Types: Cigarettes   Quit date: 02/28/1985   Years since quitting: 38.3  Smokeless Tobacco Never    Allergies  Allergen Reactions   Ciprofloxacin Hcl Anaphylaxis   Phenergan [Promethazine  Hcl] Anaphylaxis    Throat closing in   Codeine     REACTION: ABD Pain , Hallucinations   Egg-Derived Products Diarrhea   Erythromycin     REACTION: SOB   Nsaids Other (See Comments)   Other    Penicillins     REACTION: hives   Phenytoin Other (See Comments)   Prednisone    Promethazine Hcl     REACTION: thorat swelling   Propoxyphene Other (See Comments)    Other Reaction: OTHER REACTION   Sulfa Antibiotics Other (See Comments)   Sulfonamide Derivatives     REACTION: SOB   Ultram [Tramadol] Other (See Comments)    Severe headaches   Objective:  There were no vitals filed for this visit. There is no height or weight on file to calculate BMI. Constitutional Well developed. Well nourished.  Vascular Dorsalis pedis pulses palpable bilaterally. Posterior tibial pulses palpable bilaterally. Capillary refill normal to all digits.  No cyanosis or clubbing noted. Pedal hair growth normal.  Neurologic Normal speech. Oriented to person, place, and time. Epicritic sensation to light touch grossly present bilaterally.  Dermatologic Nails well groomed and normal in appearance. No open wounds. No skin lesions.  Orthopedic: Pain along the course of the peroneal tendon pain with dorsiflexion eversion of the foot resisted no pain with plantarflexion inversion of the foot.  Pain at the ATFL ligament pain with plantarflexion and inversion of the foot as well   Radiographs: None Assessment:   1. Peroneal tendinitis, right    Plan:  Patient was evaluated and treated and all questions answered.  Right peroneal tendinitis/ankle instability - All questions and concerns were discussed with the patient - Given the amount of deformity/concern that is present should benefit from cam boot immobilization to allow the soft tissue structure to repair itself I discussed with patient she states understand like to proceed with that - She would like to hold off on injection as that has not been  favorable in the past. - If there is no improvement we will discuss ankle bracing versus MRI  No follow-ups on file.

## 2023-08-08 ENCOUNTER — Ambulatory Visit: Admitting: Podiatry

## 2023-08-17 ENCOUNTER — Ambulatory Visit: Admitting: Podiatry

## 2023-08-17 DIAGNOSIS — M7671 Peroneal tendinitis, right leg: Secondary | ICD-10-CM | POA: Diagnosis not present

## 2023-08-17 NOTE — Progress Notes (Signed)
 Subjective:  Patient ID: Jenna Garner, female    DOB: 03-30-1955,  MRN: [id number]  Chief Complaint  Patient presents with   Foot Pain    Right foot pain follow up  Pt stated that it is not any better     68 y.o. female presents with the above complaint.  Patient presents for follow-up of right peroneal tendinitis/ankle instability she states that the boot the brace none of which has helped.  She would like to discuss next treatment plan  Review of Systems: Negative except as noted in the HPI. Denies N/V/F/Ch.  Past Medical History:  Diagnosis Date   Allergic rhinitis    Anal fissure    Anxiety    Arthritis    Asthma    Colon polyps    Depression    overlap with GAD   Diverticulosis    Genital warts    GERD    HOH (hard of hearing)    Hyperlipidemia    Hypothyroid    Insomnia    Irritable bowel syndrome    Meniere's disease    Migraines    OSA (obstructive sleep apnea)    uses a cpap   PONV (postoperative nausea and vomiting)    PTSD (post-traumatic stress disorder)     Current Outpatient Medications:    acetaminophen  (TYLENOL ) 500 MG tablet, Take 1,000 mg by mouth every 6 (six) hours as needed for mild pain., Disp: , Rfl:    albuterol  (PROAIR  HFA) 108 (90 BASE) MCG/ACT inhaler, Inhale 2 puffs into the lungs every 6 (six) hours as needed for wheezing., Disp: 1 Inhaler, Rfl: 3   ALPHA-LIPOIC ACID PO, Take 1 tablet by mouth in the morning, at noon, and at bedtime., Disp: , Rfl:    ALPRAZolam  (XANAX ) 0.5 MG tablet, TAKE ONE (1) TABLET THREE (3) TIMES EACHDAY AS NEEDED FOR SLEEP OR ANXIETY (Patient taking differently: Take 0.5 mg by mouth 3 (three) times daily as needed for anxiety or sleep.), Disp: 90 tablet, Rfl: 1   carisoprodol  (SOMA ) 350 MG tablet, Take 350 mg by mouth 2 (two) times daily as needed for muscle spasms., Disp: , Rfl:    citalopram  (CELEXA ) 20 MG tablet, Take 1 tablet (20 mg total) by mouth daily., Disp: 90 tablet, Rfl: 3   Cyanocobalamin (B-12 PO), Take 1  tablet by mouth daily., Disp: , Rfl:    fexofenadine  (ALLEGRA ) 180 MG tablet, Take 1 tablet (180 mg total) by mouth daily., Disp: 90 tablet, Rfl: 3   gabapentin  (NEURONTIN ) 300 MG capsule, TAKE 1 CAPSULE THREE TIMES DAILY (Patient taking differently: Take 600 mg by mouth at bedtime.), Disp: 270 capsule, Rfl: 0   levothyroxine  (SYNTHROID , LEVOTHROID) 50 MCG tablet, TAKE 1 TABLET DAILY BEFORE BREAKFAST. (Patient taking differently: Take 50 mcg by mouth daily in the afternoon.), Disp: 90 tablet, Rfl: 1   montelukast  (SINGULAIR ) 10 MG tablet, Take 1 tablet (10 mg total) by mouth at bedtime. Take 1 by mouth daily (Patient taking differently: Take 10 mg by mouth at bedtime.), Disp: 90 tablet, Rfl: 3   Multiple Vitamins-Minerals (CENTRUM SILVER PO), Take 1 tablet by mouth daily., Disp: , Rfl:    ondansetron  (ZOFRAN ) 4 MG tablet, Take 1 tablet (4 mg total) by mouth every 8 (eight) hours as needed for nausea or vomiting., Disp: 12 tablet, Rfl: 0   pantoprazole (PROTONIX) 40 MG tablet, Take 40 mg by mouth at bedtime., Disp: , Rfl:    triamterene -hydrochlorothiazide (DYAZIDE) 37.5-25 MG per capsule, Take 1 each (1  capsule total) by mouth daily., Disp: 90 capsule, Rfl: 3   valACYclovir  (VALTREX ) 500 MG tablet, Take 1 tablet (500 mg total) by mouth every other day., Disp: 45 tablet, Rfl: 3   VITAMIN D  PO, Take 1 tablet by mouth in the morning and at bedtime., Disp: , Rfl:   Social History   Tobacco Use  Smoking Status Former   Current packs/day: 0.00   Types: Cigarettes   Quit date: 02/28/1985   Years since quitting: 38.4  Smokeless Tobacco Never    Allergies  Allergen Reactions   Ciprofloxacin Hcl Anaphylaxis   Phenergan [Promethazine Hcl] Anaphylaxis    Throat closing in   Codeine     REACTION: ABD Pain , Hallucinations   Egg-Derived Products Diarrhea   Erythromycin     REACTION: SOB   Nsaids Other (See Comments)   Other    Penicillins     REACTION: hives   Phenytoin Other (See Comments)    Prednisone    Promethazine Hcl     REACTION: thorat swelling   Propoxyphene Other (See Comments)    Other Reaction: OTHER REACTION   Sulfa Antibiotics Other (See Comments)   Sulfonamide Derivatives     REACTION: SOB   Ultram [Tramadol] Other (See Comments)    Severe headaches   Objective:  There were no vitals filed for this visit. There is no height or weight on file to calculate BMI. Constitutional Well developed. Well nourished.  Vascular Dorsalis pedis pulses palpable bilaterally. Posterior tibial pulses palpable bilaterally. Capillary refill normal to all digits.  No cyanosis or clubbing noted. Pedal hair growth normal.  Neurologic Normal speech. Oriented to person, place, and time. Epicritic sensation to light touch grossly present bilaterally.  Dermatologic Nails well groomed and normal in appearance. No open wounds. No skin lesions.  Orthopedic: Pain along the course of the peroneal tendon pain with dorsiflexion eversion of the foot resisted no pain with plantarflexion inversion of the foot.  Pain at the ATFL ligament pain with plantarflexion and inversion of the foot as well   Radiographs: None Assessment:   1. Peroneal tendinitis, right    Plan:  Patient was evaluated and treated and all questions answered.  Right peroneal tendinitis/ankle instability - All questions and concerns were discussed with the patient - Clinically cam boot immobilization did not help.  She already has an ankle brace I discussed with her to go ahead and start wearing that. - Given that there is no improvement we will discuss MRI at this time.  MRI order was placed.  To rule out tearing. No follow-ups on file.

## 2023-08-17 NOTE — Addendum Note (Signed)
 Addended by: Maddux First on: 08/17/2023 02:09 PM   Modules accepted: Orders

## 2023-09-14 ENCOUNTER — Ambulatory Visit
Admission: RE | Admit: 2023-09-14 | Discharge: 2023-09-14 | Disposition: A | Discharge status: Home / Self Care | Source: Ambulatory Visit | Attending: Podiatry | Admitting: Podiatry

## 2023-09-14 DIAGNOSIS — M7671 Peroneal tendinitis, right leg: Secondary | ICD-10-CM

## 2023-10-06 ENCOUNTER — Other Ambulatory Visit: Payer: Self-pay | Admitting: Family Medicine

## 2023-10-06 DIAGNOSIS — Z1231 Encounter for screening mammogram for malignant neoplasm of breast: Secondary | ICD-10-CM

## 2023-10-10 ENCOUNTER — Ambulatory Visit: Admitting: Podiatry

## 2023-11-08 ENCOUNTER — Ambulatory Visit

## 2023-12-06 ENCOUNTER — Ambulatory Visit

## 2024-01-09 ENCOUNTER — Ambulatory Visit
Admission: RE | Admit: 2024-01-09 | Discharge: 2024-01-09 | Disposition: A | Source: Ambulatory Visit | Attending: Family Medicine | Admitting: Family Medicine

## 2024-01-09 DIAGNOSIS — Z1231 Encounter for screening mammogram for malignant neoplasm of breast: Secondary | ICD-10-CM | POA: Insufficient documentation

## 2024-02-15 ENCOUNTER — Ambulatory Visit

## 2024-03-19 ENCOUNTER — Ambulatory Visit

## 2024-04-03 ENCOUNTER — Ambulatory Visit
